# Patient Record
Sex: Female | Born: 1970
Health system: Southern US, Community
[De-identification: ages and names within clinical notes are randomized; demographics above are authoritative.]

## PROBLEM LIST (undated history)

## (undated) DIAGNOSIS — C801 Malignant (primary) neoplasm, unspecified: Secondary | ICD-10-CM

## (undated) DIAGNOSIS — R7309 Other abnormal glucose: Secondary | ICD-10-CM

## (undated) DIAGNOSIS — I10 Essential (primary) hypertension: Secondary | ICD-10-CM

## (undated) DIAGNOSIS — E119 Type 2 diabetes mellitus without complications: Secondary | ICD-10-CM

## (undated) DIAGNOSIS — E785 Hyperlipidemia, unspecified: Secondary | ICD-10-CM

## (undated) DIAGNOSIS — N882 Stricture and stenosis of cervix uteri: Secondary | ICD-10-CM

## (undated) DIAGNOSIS — D649 Anemia, unspecified: Secondary | ICD-10-CM

## (undated) HISTORY — PX: COLONOSCOPY: SHX174

## (undated) HISTORY — PX: TUBAL LIGATION: SHX77

## (undated) HISTORY — PX: WISDOM TOOTH EXTRACTION: SHX21

## (undated) HISTORY — DX: Other abnormal glucose: R73.09

## (undated) HISTORY — DX: Stricture and stenosis of cervix uteri: N88.2

## (undated) HISTORY — DX: Anemia, unspecified: D64.9

## (undated) HISTORY — DX: Type 2 diabetes mellitus without complications: E11.9

## (undated) HISTORY — DX: Hyperlipidemia, unspecified: E78.5

---
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
## 1995-09-23 HISTORY — PX: TUBAL LIGATION: SHX77

## 2014-09-11 ENCOUNTER — Emergency Department (HOSPITAL_COMMUNITY)
Admission: EM | Admit: 2014-09-11 | Discharge: 2014-09-11 | Disposition: A | Discharge status: Home / Self Care | Payer: BC Managed Care – PPO | Attending: Emergency Medicine | Admitting: Emergency Medicine

## 2014-09-11 ENCOUNTER — Encounter (HOSPITAL_COMMUNITY): Payer: Self-pay | Admitting: Family Medicine

## 2014-09-11 DIAGNOSIS — D649 Anemia, unspecified: Secondary | ICD-10-CM | POA: Diagnosis not present

## 2014-09-11 DIAGNOSIS — R102 Pelvic and perineal pain unspecified side: Secondary | ICD-10-CM

## 2014-09-11 DIAGNOSIS — Z79899 Other long term (current) drug therapy: Secondary | ICD-10-CM | POA: Insufficient documentation

## 2014-09-11 DIAGNOSIS — Z9089 Acquired absence of other organs: Secondary | ICD-10-CM | POA: Diagnosis not present

## 2014-09-11 DIAGNOSIS — Z9851 Tubal ligation status: Secondary | ICD-10-CM | POA: Insufficient documentation

## 2014-09-11 DIAGNOSIS — Z3202 Encounter for pregnancy test, result negative: Secondary | ICD-10-CM | POA: Diagnosis not present

## 2014-09-11 DIAGNOSIS — R109 Unspecified abdominal pain: Secondary | ICD-10-CM | POA: Diagnosis present

## 2014-09-11 DIAGNOSIS — I10 Essential (primary) hypertension: Secondary | ICD-10-CM | POA: Insufficient documentation

## 2014-09-11 HISTORY — DX: Essential (primary) hypertension: I10

## 2014-09-11 LAB — URINALYSIS, ROUTINE W REFLEX MICROSCOPIC
Bilirubin Urine: NEGATIVE
GLUCOSE, UA: NEGATIVE mg/dL
Ketones, ur: NEGATIVE mg/dL
LEUKOCYTES UA: NEGATIVE
Nitrite: NEGATIVE
PH: 7.5 (ref 5.0–8.0)
Protein, ur: NEGATIVE mg/dL
SPECIFIC GRAVITY, URINE: 1.017 (ref 1.005–1.030)
Urobilinogen, UA: 0.2 mg/dL (ref 0.0–1.0)

## 2014-09-11 LAB — COMPREHENSIVE METABOLIC PANEL
ALK PHOS: 68 U/L (ref 39–117)
ALT: 13 U/L (ref 0–35)
AST: 13 U/L (ref 0–37)
Albumin: 4.1 g/dL (ref 3.5–5.2)
Anion gap: 13 (ref 5–15)
BUN: 8 mg/dL (ref 6–23)
CO2: 25 mEq/L (ref 19–32)
Calcium: 8.8 mg/dL (ref 8.4–10.5)
Chloride: 103 mEq/L (ref 96–112)
Creatinine, Ser: 0.65 mg/dL (ref 0.50–1.10)
GFR calc Af Amer: >90 mL/min (ref >90)
GFR calc non Af Amer: >90 mL/min (ref >90)
Glucose, Bld: 133 mg/dL — ABNORMAL HIGH (ref 70–99)
Potassium: 3.7 mEq/L (ref 3.7–5.3)
SODIUM: 141 meq/L (ref 137–147)
TOTAL PROTEIN: 7.1 g/dL (ref 6.0–8.3)
Total Bilirubin: 0.4 mg/dL (ref 0.3–1.2)

## 2014-09-11 LAB — CBC WITH DIFFERENTIAL/PLATELET
BASOS ABS: 0 10*3/uL (ref 0.0–0.1)
Basophils Relative: 0 % (ref 0–1)
EOS ABS: 0 10*3/uL (ref 0.0–0.7)
Eosinophils Relative: 1 % (ref 0–5)
HCT: 34.8 % — ABNORMAL LOW (ref 36.0–46.0)
Hemoglobin: 11.7 g/dL — ABNORMAL LOW (ref 12.0–15.0)
Lymphocytes Relative: 15 % (ref 12–46)
Lymphs Abs: 1.3 10*3/uL (ref 0.7–4.0)
MCH: 28.8 pg (ref 26.0–34.0)
MCHC: 33.6 g/dL (ref 30.0–36.0)
MCV: 85.7 fL (ref 78.0–100.0)
Monocytes Absolute: 0.3 10*3/uL (ref 0.1–1.0)
Monocytes Relative: 4 % (ref 3–12)
Neutro Abs: 6.7 10*3/uL (ref 1.7–7.7)
Neutrophils Relative %: 80 % — ABNORMAL HIGH (ref 43–77)
PLATELETS: 225 10*3/uL (ref 150–400)
RBC: 4.06 MIL/uL (ref 3.87–5.11)
RDW: 13.4 % (ref 11.5–15.5)
WBC: 8.3 10*3/uL (ref 4.0–10.5)

## 2014-09-11 LAB — URINE MICROSCOPIC-ADD ON

## 2014-09-11 LAB — WET PREP, GENITAL
Clue Cells Wet Prep HPF POC: NONE SEEN
Trich, Wet Prep: NONE SEEN
Yeast Wet Prep HPF POC: NONE SEEN

## 2014-09-11 LAB — LIPASE, BLOOD: Lipase: 28 U/L (ref 11–59)

## 2014-09-11 LAB — POC URINE PREG, ED: PREG TEST UR: NEGATIVE

## 2014-09-11 MED ORDER — SODIUM CHLORIDE 0.9 % IV BOLUS (SEPSIS)
1000.0000 mL | Freq: Once | INTRAVENOUS | Status: AC
  Administered 2014-09-11: 1000 mL via INTRAVENOUS

## 2014-09-11 MED ORDER — PROMETHAZINE HCL 25 MG PO TABS: 25.0000 mg | ORAL_TABLET | Freq: Four times a day (QID) | ORAL | Status: DC | PRN

## 2014-09-11 NOTE — ED Provider Notes (Signed)
CSN: [id number]     Arrival date & time 09/11/14  1111 History   First MD Initiated Contact with Patient 09/11/14 1144     Chief Complaint  Patient presents with  . Abdominal Pain     (Consider location/radiation/quality/duration/timing/severity/associated sxs/prior Treatment) HPI  Suzanne Bartlett is a 43 y.o. female with PMH of PCOS, hypertension, appendectomy, tubal ligation presenting with 2-3 days of lower abdominal pain that she describes as crampy and related to her menses. At times pain radiates into top of left thigh. She has taken ibuprofen with relief of the pain. Denies any pain at this time. Patient noted passing one small clot yesterday but otherwise states her bleeding is like other menses. Patient has her menses complaint irregular. She denies taking any oral contraceptive pills or using any protection. She denies history of STDs. She denies any increase in vaginal discharge without odor. She denies any urinary symptoms. Patient denies nausea, vomiting, diarrhea. Last stool yesterday and normal nonbloody. Nursing note reported some low back pain but she denies this to me. Denies fevers or chills.   Past Medical History  Diagnosis Date  . Hypertension    Past Surgical History  Procedure Laterality Date  . Tubal ligation    . Appendectomy     History reviewed. No pertinent family history. History  Substance Use Topics  . Smoking status: Never Smoker   . Smokeless tobacco: Not on file  . Alcohol Use: No   OB History    No data available     Review of Systems  Constitutional: Negative for fever and chills.  HENT: Negative for congestion and rhinorrhea.   Eyes: Negative for visual disturbance.  Respiratory: Negative for cough and shortness of breath.   Cardiovascular: Negative for chest pain and palpitations.  Gastrointestinal: Positive for abdominal pain. Negative for nausea, vomiting and diarrhea.  Genitourinary: Negative for dysuria and hematuria.   Musculoskeletal: Negative for gait problem.  Skin: Negative for rash.  Neurological: Negative for weakness and headaches.      Allergies  Review of patient's allergies indicates no known allergies.  Home Medications   Prior to Admission medications   Medication Sig Start Date End Date Taking? Authorizing Provider  furosemide (LASIX) 20 MG tablet Take 20 mg by mouth.   Yes Historical Provider, MD  ibuprofen (ADVIL,MOTRIN) 400 MG tablet Take 800 mg by mouth every 6 (six) hours as needed for mild pain or moderate pain.   Yes Historical Provider, MD  lisinopril (PRINIVIL,ZESTRIL) 20 MG tablet Take 20 mg by mouth daily.   Yes Historical Provider, MD  promethazine (PHENERGAN) 25 MG tablet Take 1 tablet (25 mg total) by mouth every 6 (six) hours as needed for nausea or vomiting. 09/11/14   Pura Spice, PA-C   BP 112/59 mmHg  Pulse 79  Temp(Src) 97.9 F (36.6 C)  Resp 14  Ht 5\' 4"  (1.626 m)  Wt 300 lb (136.079 kg)  BMI 51.47 kg/m2  SpO2 99%  LMP 09/08/2014 Physical Exam  Constitutional: She appears well-developed and well-nourished. No distress.  HENT:  Head: Normocephalic and atraumatic.  Eyes: Conjunctivae and EOM are normal. Right eye exhibits no discharge. Left eye exhibits no discharge.  Cardiovascular: Normal rate, regular rhythm and normal heart sounds.   Pulmonary/Chest: Effort normal and breath sounds normal. No respiratory distress. She has no wheezes.  Abdominal: Soft. Bowel sounds are normal. She exhibits no distension. There is no tenderness.  Genitourinary:  Cervix pink without lesions. Os closed. No CMT or  right or left adnexal tenderness or masses noted. Minimal bloody  discharge what does not reaccumulate rapidly without foul odor. Nurse in room for exam  Neurological: She is alert. She exhibits normal muscle tone. Coordination normal.  Skin: Skin is warm and dry. She is not diaphoretic.  Nursing note and vitals reviewed.   ED Course  Procedures  (including critical care time) Labs Review Labs Reviewed  WET PREP, GENITAL - Abnormal; Notable for the following:    WBC, Wet Prep HPF POC FEW (*)    All other components within normal limits  CBC WITH DIFFERENTIAL - Abnormal; Notable for the following:    Hemoglobin 11.7 (*)    HCT 34.8 (*)    Neutrophils Relative % 80 (*)    All other components within normal limits  COMPREHENSIVE METABOLIC PANEL - Abnormal; Notable for the following:    Glucose, Bld 133 (*)    All other components within normal limits  URINALYSIS, ROUTINE W REFLEX MICROSCOPIC - Abnormal; Notable for the following:    Hgb urine dipstick MODERATE (*)    All other components within normal limits  URINE MICROSCOPIC-ADD ON - Abnormal; Notable for the following:    Squamous Epithelial / LPF FEW (*)    All other components within normal limits  GC/CHLAMYDIA PROBE AMP  LIPASE, BLOOD  RPR  HIV ANTIBODY (ROUTINE TESTING)  POC URINE PREG, ED    Imaging Review No results found.   EKG Interpretation None      MDM   Final diagnoses:  Pelvic pain in female  Mild anemia   Patient presenting with left lower sided abdominal tenderness that at times radiates into left thigh that is associated with her menses. Patient has taken ibuprofen and at presentation she is asymptomatic. She has also with the complaint of passing one small clot. Patient denies nausea, vomiting. Patient given fluids in the ED. Patient with completely benign abdominal exam. Pelvic with normal discomfort. Patient with bloody discharge that does not reaccumulate rapidly. Patient informed that STD panel is pending and she will be informed if any of the results become positive. If they are she is to inform her partners to be evaluated and/or treated. Patient nonpregnant and laboratory findings reassuring. Patient is currently not in any discomfort I doubt ovarian torsion. Wet prep with few white blood cells no clue cells. Patient without CMT or adnexal  tenderness. I doubt PID. Patient to follow-up with [facility] for further evaluation with possible outpatient pelvic ultrasound.  Discussed return precautions with patient. Discussed all results and patient verbalizes understanding and agrees with plan.  Case has been discussed with Dr. Audie Pinto who agrees with the above plan and to discharge.      Pura Spice, PA-C 09/11/14 1552  Dot Lanes, MD 09/18/14 [phone]

## 2014-09-11 NOTE — Discharge Instructions (Signed)
Return to the emergency room with worsening of symptoms, new symptoms or with symptoms that are concerning, especially severe pain, fevers, unable to tolerate fluids by mouth, losing large amounts of blood and feeling lightheaded or chest pain. Ibuprofen 400mg  (2 tablets 200mg ) every 5-6 hours for 3-5 days. Start taking 2 days before menses. Max dose in 24hours 3 grams.  Call or make an appointment with [facility] as well as discussing having/scheduling a pelvic ultrasound.   Abdominal Pain, Women Abdominal (stomach, pelvic, or belly) pain can be caused by many things. It is important to tell your doctor:  The location of the pain.  Does it come and go or is it present all the time?  Are there things that start the pain (eating certain foods, exercise)?  Are there other symptoms associated with the pain (fever, nausea, vomiting, diarrhea)? All of this is helpful to know when trying to find the cause of the pain. CAUSES   Stomach: virus or bacteria infection, or ulcer.  Intestine: appendicitis (inflamed appendix), regional ileitis (Crohn's disease), ulcerative colitis (inflamed colon), irritable bowel syndrome, diverticulitis (inflamed diverticulum of the colon), or cancer of the stomach or intestine.  Gallbladder disease or stones in the gallbladder.  Kidney disease, kidney stones, or infection.  Pancreas infection or cancer.  Fibromyalgia (pain disorder).  Diseases of the female organs:  Uterus: fibroid (non-cancerous) tumors or infection.  Fallopian tubes: infection or tubal pregnancy.  Ovary: cysts or tumors.  Pelvic adhesions (scar tissue).  Endometriosis (uterus lining tissue growing in the pelvis and on the pelvic organs).  Pelvic congestion syndrome (female organs filling up with blood just before the menstrual period).  Pain with the menstrual period.  Pain with ovulation (producing an egg).  Pain with an IUD (intrauterine device, birth control) in the  uterus.  Cancer of the female organs.  Functional pain (pain not caused by a disease, may improve without treatment).  Psychological pain.  Depression. DIAGNOSIS  Your doctor will decide the seriousness of your pain by doing an examination.  Blood tests.  X-rays.  Ultrasound.  CT scan (computed tomography, special type of X-ray).  MRI (magnetic resonance imaging).  Cultures, for infection.  Barium enema (dye inserted in the large intestine, to better view it with X-rays).  Colonoscopy (looking in intestine with a lighted tube).  Laparoscopy (minor surgery, looking in abdomen with a lighted tube).  Major abdominal exploratory surgery (looking in abdomen with a large incision). TREATMENT  The treatment will depend on the cause of the pain.   Many cases can be observed and treated at home.  Over-the-counter medicines recommended by your caregiver.  Prescription medicine.  Antibiotics, for infection.  Birth control pills, for painful periods or for ovulation pain.  Hormone treatment, for endometriosis.  Nerve blocking injections.  Physical therapy.  Antidepressants.  Counseling with a psychologist or psychiatrist.  Minor or major surgery. HOME CARE INSTRUCTIONS   Do not take laxatives, unless directed by your caregiver.  Take over-the-counter pain medicine only if ordered by your caregiver. Do not take aspirin because it can cause an upset stomach or bleeding.  Try a clear liquid diet (broth or water) as ordered by your caregiver. Slowly move to a bland diet, as tolerated, if the pain is related to the stomach or intestine.  Have a thermometer and take your temperature several times a day, and record it.  Bed rest and sleep, if it helps the pain.  Avoid sexual intercourse, if it causes pain.  Avoid  stressful situations.  Keep your follow-up appointments and tests, as your caregiver orders.  If the pain does not go away with medicine or surgery, you  may try:  Acupuncture.  Relaxation exercises (yoga, meditation).  Group therapy.  Counseling. SEEK MEDICAL CARE IF:   You notice certain foods cause stomach pain.  Your home care treatment is not helping your pain.  You need stronger pain medicine.  You want your IUD removed.  You feel faint or lightheaded.  You develop nausea and vomiting.  You develop a rash.  You are having side effects or an allergy to your medicine. SEEK IMMEDIATE MEDICAL CARE IF:   Your pain does not go away or gets worse.  You have a fever.  Your pain is felt only in portions of the abdomen. The right side could possibly be appendicitis. The left lower portion of the abdomen could be colitis or diverticulitis.  You are passing blood in your stools (bright red or black tarry stools, with or without vomiting).  You have blood in your urine.  You develop chills, with or without a fever.  You pass out. MAKE SURE YOU:   Understand these instructions.  Will watch your condition.  Will get help right away if you are not doing well or get worse. Document Released: 07/06/2007 Document Revised: 01/23/2014 Document Reviewed: 07/26/2009 [facility] Patient Information 2015 [place], [place]. This information is not intended to replace advice given to you by your health care provider. Make sure you discuss any questions you have with your health care provider.

## 2014-09-11 NOTE — ED Notes (Signed)
Pt here for left lower abdominal pain. sts pain radiates into left leg. sts ibuprofen not helping. sts urinary retention.

## 2014-09-11 NOTE — ED Notes (Signed)
Pt. Reports on her period and that she is having cramps which is abnormal for patient. States cramping to left groin/pelvic area that radiates to top of left thigh. Also reports low back pain. Took 1600mg  ibuprofen PTA and is denying pain at this time. Hx of tubal ligation.  Also reports she is on lasix, did not take yesterday and reports minimal output today after taking.

## 2014-09-12 LAB — HIV ANTIBODY (ROUTINE TESTING W REFLEX): HIV 1&2 Ab, 4th Generation: NONREACTIVE

## 2014-09-12 LAB — RPR

## 2014-09-12 LAB — GC/CHLAMYDIA PROBE AMP
CT Probe RNA: NEGATIVE
GC Probe RNA: NEGATIVE

## 2014-12-01 ENCOUNTER — Ambulatory Visit (INDEPENDENT_AMBULATORY_CARE_PROVIDER_SITE_OTHER): Payer: BLUE CROSS/BLUE SHIELD | Admitting: Obstetrics and Gynecology

## 2014-12-01 ENCOUNTER — Encounter: Payer: Self-pay | Admitting: Obstetrics and Gynecology

## 2014-12-01 VITALS — BP 142/88 | HR 86 | Resp 16 | Ht 65.0 in | Wt 298.6 lb

## 2014-12-01 DIAGNOSIS — Z1239 Encounter for other screening for malignant neoplasm of breast: Secondary | ICD-10-CM | POA: Diagnosis not present

## 2014-12-01 DIAGNOSIS — Z0000 Encounter for general adult medical examination without abnormal findings: Secondary | ICD-10-CM

## 2014-12-01 DIAGNOSIS — Z23 Encounter for immunization: Secondary | ICD-10-CM | POA: Diagnosis not present

## 2014-12-01 DIAGNOSIS — R102 Pelvic and perineal pain: Secondary | ICD-10-CM

## 2014-12-01 DIAGNOSIS — Z01419 Encounter for gynecological examination (general) (routine) without abnormal findings: Secondary | ICD-10-CM | POA: Diagnosis not present

## 2014-12-01 DIAGNOSIS — Z8742 Personal history of other diseases of the female genital tract: Secondary | ICD-10-CM

## 2014-12-01 DIAGNOSIS — N946 Dysmenorrhea, unspecified: Secondary | ICD-10-CM

## 2014-12-01 LAB — POCT URINALYSIS DIPSTICK
PH UA: 5
Urobilinogen, UA: NEGATIVE

## 2014-12-01 LAB — POCT URINE PREGNANCY: Preg Test, Ur: NEGATIVE

## 2014-12-01 NOTE — Progress Notes (Signed)
44 y.o. G2P2 MarriedCaucasianF here for annual exam.    PCP - [facility] - [place], [place]. Will continue to do primary care there. Declines doing any lab work here today.   Seen in emergency dept in December 2015 for pelvic pain related to her menses.  Pain radiating into left thigh and left lower back.  Told it was a spasm and to have ultrasound done at the time that a spasm occurs.  No imaging done at that time.   Menses were infrequent after birth of second child.  Saw her provider and was told she had PCOS and was started on OCPs to regulate them.  Eventually stopped pills.  Treated with Glucophage in past as well.  Asking what PCOS means to her.  Has hair growth on face.  Trims and plucks.  Plans to do electrolysis.   Borderline blood sugar control.   Currently having menses every 30 days, lasting for 5 days, heavy for second day.  August 2015 no cycle.  Having cramping during cycle treated with ibuprofen.  This is new for patient.  No bleeding outside of her cycle time.   Moved to [place] 4 months ago from [place].   Patient's last menstrual period was 11/06/2014.          Sexually active: Yes.    The current method of family planning is tubal ligation.    Exercising: No.  The patient does not participate in regular exercise at present. Smoker:  Former smoker, early 20's  Health Maintenance: Pap:  2012- wnl no hpv testing Histoy of abnormal Pap:  no MMG:  Never had one  Colonoscopy: never had one BMD:   Never had one  TDaP:  Is due Screening Labs: yes, Hb today: 12.3 , Urine today: Trace Leuks - asymptomatic.   reports that she has never smoked. She does not have any smokeless tobacco history on file. She reports that she drinks alcohol. She reports that she does not use illicit drugs.  Past Medical History  Diagnosis Date  . Hypertension     Past Surgical History  Procedure Laterality Date  . Tubal ligation  1197/1998  . Appendectomy   1984    Current Outpatient Prescriptions  Medication Sig Dispense Refill  . furosemide (LASIX) 20 MG tablet Take 20 mg by mouth.    Marland Kitchen lisinopril (PRINIVIL,ZESTRIL) 20 MG tablet Take 20 mg by mouth daily.    Marland Kitchen ibuprofen (ADVIL,MOTRIN) 400 MG tablet Take 800 mg by mouth every 6 (six) hours as needed for mild pain or moderate pain.     No current facility-administered medications for this visit.    Family History  Problem Relation Age of Onset  . Breast cancer Maternal Grandmother     Late 20's  . Diabetes      mother's side    ROS:  Pertinent items are noted in HPI.  Otherwise, a comprehensive ROS was negative.  Exam:   BP 142/88 mmHg  Pulse 86  Resp 16  Ht 5\' 5"  (1.651 m)  Wt 298 lb 9.6 oz (135.444 kg)  BMI 49.69 kg/m2  LMP 11/06/2014      Height: 5\' 5"  (165.1 cm)  Ht Readings from Last 3 Encounters:  12/01/14 5\' 5"  (1.651 m)  09/11/14 5\' 4"  (1.626 m)    General appearance: alert, cooperative and appears stated age Head: Normocephalic, without obvious abnormality, atraumatic Neck: no adenopathy, supple, symmetrical, trachea midline and thyroid normal to inspection and palpation Lungs: clear to auscultation  bilaterally Breasts: normal appearance, no masses or tenderness, Inspection negative, No nipple retraction or dimpling, No nipple discharge or bleeding, No axillary or supraclavicular adenopathy Heart: regular rate and rhythm Abdomen: obese, soft, non-tender; bowel sounds normal; no masses,  no organomegaly Extremities: extremities normal, atraumatic, no cyanosis or edema Skin: Skin color, texture, turgor normal. Medial thighs with brown velvety skin coloration change.  Chin with signs of hair growth.  Lymph nodes: Cervical, supraclavicular, and axillary nodes normal. No abnormal inguinal nodes palpated Neurologic: Grossly normal  Pelvic: External genitalia:  no lesions              Urethra:  normal appearing urethra with no masses, tenderness or lesions               Bartholins and Skenes: normal                 Vagina: normal appearing vagina with normal color and discharge, no lesions              Cervix: no lesions              Pap taken: Yes.   Bimanual Exam:  Uterus:  normal size, contour, position, consistency, mobility, non-tender - Bimanual exam is limited by body habitus.               Adnexa: no mass, fullness, tenderness               Rectovaginal: Confirms               Anus:  normal sphincter tone, no lesions  Chaperone was present for exam.  A:  Well Woman with normal exam. Morbid obesity.  History of PCOS. Dysmenorrhea.  Hypertension.   P:   Mammogram scheduled for patient.  This will be her first one.  pap smear and HR HPV performed.  I discussed polycystic ovarian disease and risk of diabetes and endometrial hyperplasia/cancer.   I discussed hormonal regulation of cycles and treatment of painful menses/pelvic pain with progesterone contraceptives as they do not affect blood pressure.  I discussed treatment options for facial hair growth and that estrogen containing birth control is not an option due to HTN.   Will have patient return for pelvic ultrasound.  I strongly recommended follow up of blood glucose as patient is developing stigmata of diabetes mellitus. She declines labs here today.  TDap. return annually or prn  An additional 20 minutes spent face to face time discussing dysmenorrhea/pelvic pain and PCOS - of which over 50% was spent in counseling.

## 2014-12-01 NOTE — Patient Instructions (Addendum)
EXERCISE AND DIET:  We recommended that you start or continue a regular exercise program for good health. Regular exercise means any activity that makes your heart beat faster and makes you sweat.  We recommend exercising at least 30 minutes per day at least 3 days a week, preferably 4 or 5.  We also recommend a diet low in fat and sugar.  Inactivity, poor dietary choices and obesity can cause diabetes, heart attack, stroke, and kidney damage, among others.    ALCOHOL AND SMOKING:  Women should limit their alcohol intake to no more than 7 drinks/beers/glasses of wine (combined, not each!) per week. Moderation of alcohol intake to this level decreases your risk of breast cancer and liver damage. And of course, no recreational drugs are part of a healthy lifestyle.  And absolutely no smoking or even second hand smoke. Most people know smoking can cause heart and lung diseases, but did you know it also contributes to weakening of your bones? Aging of your skin?  Yellowing of your teeth and nails?  CALCIUM AND VITAMIN D:  Adequate intake of calcium and Vitamin D are recommended.  The recommendations for exact amounts of these supplements seem to change often, but generally speaking 600 mg of calcium (either carbonate or citrate) and 800 units of Vitamin D per day seems prudent. Certain women may benefit from higher intake of Vitamin D.  If you are among these women, your doctor will have told you during your visit.    PAP SMEARS:  Pap smears, to check for cervical cancer or precancers,  have traditionally been done yearly, although recent scientific advances have shown that most women can have pap smears less often.  However, every woman still should have a physical exam from her gynecologist every year. It will include a breast check, inspection of the vulva and vagina to check for abnormal growths or skin changes, a visual exam of the cervix, and then an exam to evaluate the size and shape of the uterus and  ovaries.  And after 46 years of age, a rectal exam is indicated to check for rectal cancers. We will also provide age appropriate advice regarding health maintenance, like when you should have certain vaccines, screening for sexually transmitted diseases, bone density testing, colonoscopy, mammograms, etc.   MAMMOGRAMS:  All women over 40 years old should have a yearly mammogram. Many facilities now offer a "3D" mammogram, which may cost around $50 extra out of pocket. If possible,  we recommend you accept the option to have the 3D mammogram performed.  It both reduces the number of women who will be called back for extra views which then turn out to be normal, and it is better than the routine mammogram at detecting truly abnormal areas.    COLONOSCOPY:  Colonoscopy to screen for colon cancer is recommended for all women at age 50.  We know, you hate the idea of the prep.  We agree, BUT, having colon cancer and not knowing it is worse!!  Colon cancer so often starts as a polyp that can be seen and removed at colonscopy, which can quite literally save your life!  And if your first colonoscopy is normal and you have no family history of colon cancer, most women don't have to have it again for 10 years.  Once every ten years, you can do something that may end up saving your life, right?  We will be happy to help you get it scheduled when you are ready.    Be sure to check your insurance coverage so you understand how much it will cost.  It may be covered as a preventative service at no cost, but you should check your particular policy.     Norethindrone tablets (contraception) What is this medicine? NORETHINDRONE (nor eth IN drone) is an oral contraceptive. The product contains a female hormone known as a progestin. It is used to prevent pregnancy. This medicine may be used for other purposes; ask your health care provider or pharmacist if you have questions. COMMON BRAND NAME(S): Camila, Deblitane 28-Day,  Errin, Heather, [place], Jolivette, Donna, Nor-QD, Nora-BE, Norlyroc, Ortho Micronor, [employer] 28-Day What should I tell my health care provider before I take this medicine? They need to know if you have any of these conditions: -blood vessel disease or blood clots -breast, cervical, or vaginal cancer -diabetes -heart disease -kidney disease -liver disease -mental depression -migraine -seizures -stroke -vaginal bleeding -an unusual or allergic reaction to norethindrone, other medicines, foods, dyes, or preservatives -pregnant or trying to get pregnant -breast-feeding How should I use this medicine? Take this medicine by mouth with a glass of water. You may take it with or without food. Follow the directions on the prescription label. Take this medicine at the same time each day and in the order directed on the package. Do not take your medicine more often than directed. Contact your pediatrician regarding the use of this medicine in children. Special care may be needed. This medicine has been used in female children who have started having menstrual periods. A patient package insert for the product will be given with each prescription and refill. Read this sheet carefully each time. The sheet may change frequently. Overdosage: If you think you have taken too much of this medicine contact a poison control center or emergency room at once. NOTE: This medicine is only for you. Do not share this medicine with others. What if I miss a dose? Try not to miss a dose. Every time you miss a dose or take a dose late your chance of pregnancy increases. When 1 pill is missed (even if only 3 hours late), take the missed pill as soon as possible and continue taking a pill each day at the regular time (use a back up method of birth control for the next 48 hours). If more than 1 dose is missed, use an additional birth control method for the rest of your pill pack until menses occurs. Contact your health care  professional if more than 1 dose has been missed. What may interact with this medicine? Do not take this medicine with any of the following medications: -amprenavir or fosamprenavir -bosentan This medicine may also interact with the following medications: -antibiotics or medicines for infections, especially rifampin, rifabutin, rifapentine, and griseofulvin, and possibly penicillins or tetracyclines -aprepitant -barbiturate medicines, such as phenobarbital -carbamazepine -felbamate -modafinil -oxcarbazepine -phenytoin -ritonavir or other medicines for HIV infection or AIDS -St. John's wort -topiramate This list may not describe all possible interactions. Give your health care provider a list of all the medicines, herbs, non-prescription drugs, or dietary supplements you use. Also tell them if you smoke, drink alcohol, or use illegal drugs. Some items may interact with your medicine. What should I watch for while using this medicine? Visit your doctor or health care professional for regular checks on your progress. You will need a regular breast and pelvic exam and Pap smear while on this medicine. Use an additional method of birth control during the first cycle that  you take these tablets. If you have any reason to think you are pregnant, stop taking this medicine right away and contact your doctor or health care professional. If you are taking this medicine for hormone related problems, it may take several cycles of use to see improvement in your condition. This medicine does not protect you against HIV infection (AIDS) or any other sexually transmitted diseases. What side effects may I notice from receiving this medicine? Side effects that you should report to your doctor or health care professional as soon as possible: -breast tenderness or discharge -pain in the abdomen, chest, groin or leg -severe headache -skin rash, itching, or hives -sudden shortness of breath -unusually weak  or tired -vision or speech problems -yellowing of skin or eyes Side effects that usually do not require medical attention (report to your doctor or health care professional if they continue or are bothersome): -changes in sexual desire -change in menstrual flow -facial hair growth -fluid retention and swelling -headache -irritability -nausea -weight gain or loss This list may not describe all possible side effects. Call your doctor for medical advice about side effects. You may report side effects to FDA at 1-800-FDA-1088. Where should I keep my medicine? Keep out of the reach of children. Store at room temperature between 15 and 30 degrees C (59 and 86 degrees F). Throw away any unused medicine after the expiration date. NOTE: This sheet is a summary. It may not cover all possible information. If you have questions about this medicine, talk to your doctor, pharmacist, or health care provider.  2015, Elsevier/Gold Standard. (2012-05-28 16:41:35)  Polycystic Ovarian Syndrome Polycystic ovarian syndrome (PCOS) is a common hormonal disorder among women of reproductive age. Most women with PCOS grow many small cysts on their ovaries. PCOS can cause problems with your periods and make it difficult to get pregnant. It can also cause an increased risk of miscarriage with pregnancy. If left untreated, PCOS can lead to serious health problems, such as diabetes and heart disease. CAUSES The cause of PCOS is not fully understood, but genetics may be a factor. SIGNS AND SYMPTOMS   Infrequent or no menstrual periods.   Inability to get pregnant (infertility) because of not ovulating.   Increased growth of hair on the face, chest, stomach, back, thumbs, thighs, or toes.   Acne, oily skin, or dandruff.   Pelvic pain.   Weight gain or obesity, usually carrying extra weight around the waist.   Type 2 diabetes.   High cholesterol.   High blood pressure.   Female-pattern baldness  or thinning hair.   Patches of thickened and dark brown or black skin on the neck, arms, breasts, or thighs.   Tiny excess flaps of skin (skin tags) in the armpits or neck area.   Excessive snoring and having breathing stop at times while asleep (sleep apnea).   Deepening of the voice.   Gestational diabetes when pregnant.  DIAGNOSIS  There is no single test to diagnose PCOS.   Your health care provider will:   Take a medical history.   Perform a pelvic exam.   Have ultrasonography done.   Check your female and female hormone levels.   Measure glucose or sugar levels in the blood.   Do other blood tests.   If you are producing too many female hormones, your health care provider will make sure it is from PCOS. At the physical exam, your health care provider will want to evaluate the areas of increased hair  growth. Try to allow natural hair growth for a few days before the visit.   During a pelvic exam, the ovaries may be enlarged or swollen because of the increased number of small cysts. This can be seen more easily by using vaginal ultrasonography or screening to examine the ovaries and lining of the uterus (endometrium) for cysts. The uterine lining may become thicker if you have not been having a regular period.  TREATMENT  Because there is no cure for PCOS, it needs to be managed to prevent problems. Treatments are based on your symptoms. Treatment is also based on whether you want to have a baby or whether you need contraception.  Treatment may include:   Progesterone hormone to start a menstrual period.   Birth control pills to make you have regular menstrual periods.   Medicines to make you ovulate, if you want to get pregnant.   Medicines to control your insulin.   Medicine to control your blood pressure.   Medicine and diet to control your high cholesterol and triglycerides in your blood.  Medicine to reduce excessive hair growth.  Surgery,  making small holes in the ovary, to decrease the amount of female hormone production. This is done through a long, lighted tube (laparoscope) placed into the pelvis through a tiny incision in the lower abdomen.  HOME CARE INSTRUCTIONS  Only take over-the-counter or prescription medicine as directed by your health care provider.  Pay attention to the foods you eat and your activity levels. This can help reduce the effects of PCOS.  Keep your weight under control.  Eat foods that are low in carbohydrate and high in fiber.  Exercise regularly. SEEK MEDICAL CARE IF:  Your symptoms do not get better with medicine.  You have new symptoms. Document Released: 01/02/2005 Document Revised: 06/29/2013 Document Reviewed: 02/24/2013 [facility] Patient Information 2015 [place], [place]. This information is not intended to replace advice given to you by your health care provider. Make sure you discuss any questions you have with your health care provider.

## 2014-12-01 NOTE — Progress Notes (Signed)
Scheduled screening mammogram for [facility] at 12/13/14 at 0720. Patient is given printed appointment reminder and map to location.

## 2014-12-04 ENCOUNTER — Telehealth: Payer: Self-pay | Admitting: Obstetrics and Gynecology

## 2014-12-04 LAB — HEMOGLOBIN, FINGERSTICK: HEMOGLOBIN, FINGERSTICK: 12.3 g/dL (ref 12.0–16.0)

## 2014-12-04 NOTE — Telephone Encounter (Signed)
Left message for patient to call back. Need to go over benefits and schedule PUS °

## 2014-12-05 NOTE — Telephone Encounter (Signed)
Call to patient. Advised of benefit quote received for PUS.  Patient agreeable. She will call back to schedule. Staff message to Dr Quincy Simmonds

## 2014-12-05 NOTE — Telephone Encounter (Signed)
Returning call.

## 2014-12-06 LAB — IPS PAP TEST WITH HPV

## 2014-12-07 ENCOUNTER — Telehealth: Payer: Self-pay | Admitting: Emergency Medicine

## 2014-12-07 NOTE — Telephone Encounter (Signed)
Spoke with patient. She is scheduled for Mammogram 12/13/14. She requests a Wednesday Morning appointment before 11 am only. Scheduled first available with Dr. Quincy Simmonds within patient request parameters for 01/10/15. Patient agreeable. She will call back as needed.  Routing to provider for final review. Patient agreeable to disposition. Will close encounter

## 2014-12-07 NOTE — Telephone Encounter (Addendum)
  Per Dr. Quincy Simmonds:   Please contact patient to set up a follow up appointment for the end of March or the beginning of April.  I would like to see her after her mammogram is complete.

## 2014-12-07 NOTE — Telephone Encounter (Signed)
Message left to return call to Neve Branscomb at [phone].    

## 2014-12-13 ENCOUNTER — Ambulatory Visit
Admission: RE | Admit: 2014-12-13 | Discharge: 2014-12-13 | Disposition: A | Discharge status: Home / Self Care | Payer: BLUE CROSS/BLUE SHIELD | Source: Ambulatory Visit | Attending: Obstetrics and Gynecology | Admitting: Obstetrics and Gynecology

## 2014-12-13 DIAGNOSIS — Z1239 Encounter for other screening for malignant neoplasm of breast: Secondary | ICD-10-CM

## 2014-12-14 NOTE — Telephone Encounter (Signed)
Patient is scheduled for follow up appt. Declined PUS appt

## 2015-01-08 ENCOUNTER — Telehealth: Payer: Self-pay | Admitting: Obstetrics and Gynecology

## 2015-01-08 NOTE — Telephone Encounter (Signed)
Patient called and cancelled her appointment for "follow up." She said, "I am out of leave at work and will call back to reschedule when I am available."  Cc: Estill Bamberg

## 2015-01-08 NOTE — Telephone Encounter (Signed)
Left patient a message to call back when ready to reschedule, canceled "follow up appointment, 01/10/15"  by automated reminder call.

## 2015-01-08 NOTE — Telephone Encounter (Signed)
Thank you for the note.  I have closed the encounter.

## 2015-01-10 ENCOUNTER — Ambulatory Visit: Payer: BLUE CROSS/BLUE SHIELD | Admitting: Obstetrics and Gynecology

## 2016-02-11 ENCOUNTER — Telehealth: Payer: Self-pay | Admitting: Obstetrics and Gynecology

## 2016-02-11 ENCOUNTER — Ambulatory Visit (INDEPENDENT_AMBULATORY_CARE_PROVIDER_SITE_OTHER): Payer: BLUE CROSS/BLUE SHIELD | Admitting: Obstetrics and Gynecology

## 2016-02-11 ENCOUNTER — Encounter: Payer: Self-pay | Admitting: Obstetrics and Gynecology

## 2016-02-11 VITALS — BP 116/80 | HR 80 | Resp 16 | Ht 65.0 in | Wt 309.0 lb

## 2016-02-11 DIAGNOSIS — N921 Excessive and frequent menstruation with irregular cycle: Secondary | ICD-10-CM | POA: Diagnosis not present

## 2016-02-11 DIAGNOSIS — N926 Irregular menstruation, unspecified: Secondary | ICD-10-CM | POA: Diagnosis not present

## 2016-02-11 LAB — CBC
HCT: 36.1 % (ref 35.0–45.0)
HEMOGLOBIN: 12.2 g/dL (ref 11.7–15.5)
MCH: 28.8 pg (ref 27.0–33.0)
MCHC: 33.8 g/dL (ref 32.0–36.0)
MCV: 85.1 fL (ref 80.0–100.0)
MPV: 9.9 fL (ref 7.5–12.5)
Platelets: 276 10*3/uL (ref 140–400)
RBC: 4.24 MIL/uL (ref 3.80–5.10)
RDW: 14.9 % (ref 11.0–15.0)
WBC: 7.5 10*3/uL (ref 3.8–10.8)

## 2016-02-11 MED ORDER — MEDROXYPROGESTERONE ACETATE 10 MG PO TABS: 10.0000 mg | ORAL_TABLET | Freq: Every day | ORAL | Status: DC

## 2016-02-11 NOTE — Progress Notes (Signed)
GYNECOLOGY  VISIT   HPI: 45 y.o.   Married  Caucasian  female   G2P2 with No LMP recorded.   here for  Vaginal bleeding since 01/21/16 w/ irregular bleeding/cramping/pain in back  Was expecting menses in mid April.  Light pink bleeding started intermittently since 01/21/16.  This lasted for 4 - 5 nights. Heavy bleeding started 02/08/16 and changing a pad every 1.5 hours.  Clots. Cramping.   Not lightheaded.  Feels a little "Funny and sedated."  Prior to this was having cycles every month.  Heavy 2nd to 3rd day.  Using regular tampons.   Does have a hx of prior anemia.  Status post BTL.   Hx PCOS.   GYNECOLOGIC HISTORY: No LMP recorded. Contraception:  Tubal ligation Menopausal hormone therapy:  None Last mammogram:  12/13/14 BIRADS1 negative Last pap smear:   12/01/14 Pap and HR HPV negative        OB History    Gravida Para Term Preterm AB TAB SAB Ectopic Multiple Living   2 2        2          There are no active problems to display for this patient.   Past Medical History  Diagnosis Date  . Hypertension     Past Surgical History  Procedure Laterality Date  . Tubal ligation  1197/1998  . Appendectomy  1984    Current Outpatient Prescriptions  Medication Sig Dispense Refill  . furosemide (LASIX) 20 MG tablet Take 20 mg by mouth.    Marland Kitchen ibuprofen (ADVIL,MOTRIN) 400 MG tablet Take 800 mg by mouth every 6 (six) hours as needed for mild pain or moderate pain.    Marland Kitchen lisinopril (PRINIVIL,ZESTRIL) 20 MG tablet Take 20 mg by mouth daily.     No current facility-administered medications for this visit.     ALLERGIES: Review of patient's allergies indicates no known allergies.  Family History  Problem Relation Age of Onset  . Breast cancer Maternal Grandmother     Late 8's  . Diabetes      mother's side    Social History   Social History  . Marital Status: Married    Spouse Name: N/A  . Number of Children: N/A  . Years of Education: N/A   Occupational  History  . Not on file.   Social History Main Topics  . Smoking status: Former Research scientist (life sciences)  . Smokeless tobacco: Not on file  . Alcohol Use: 0.0 oz/week    0 Standard drinks or equivalent per week     Comment: occasionally  . Drug Use: No  . Sexual Activity: Yes    Birth Control/ Protection: Surgical     Comment: Tubal Ligation   Other Topics Concern  . Not on file   Social History Narrative    ROS:  Pertinent items are noted in HPI.  PHYSICAL EXAMINATION:    There were no vitals taken for this visit.    General appearance: alert, cooperative and appears stated age    Pelvic: External genitalia:  no lesions.  Medial thighs with brown skin color change.              Urethra:  normal appearing urethra with no masses, tenderness or lesions              Bartholins and Skenes: normal                 Vagina: normal appearing vagina with normal color and discharge, no lesions.  Red blood in the vagina.  Not rapidly accumulating.               Cervix: no lesions and anterior lip seen.              Bimanual Exam:  Uterus:  normal size, contour, position, consistency, mobility, non-tender              Adnexa: normal adnexa and no mass, fullness, tenderness              Chaperone was present for exam.  ASSESSMENT  Abnormal menses.  Menorrhagia with prolonged cycle. Probable anovulatory bleeding. Hx probable PCOS.  Status post BTL. HTN.  PLAN  Check CBC now.  Provera 10 mg po x 10 days.  Discussed side effects and mechanism of action of Provera.  Knows to expect further bleeding when stops the Provera.  Will return for an annual exam and recheck appt. In about 3 weeks. Discussed there may be need for ultrasound and further evaluation if bleeding is not controlled.  We can discuss further options for controlling menstrual cycles at her follow and annual.  Call for persistent or worsening bleeding, lightheadedness, malaise. Questions invited and answered.    An After Visit  Summary was printed and given to the patient.  __15____ minutes face to face time of which over 50% was spent in counseling.    Addendum  Patient was called at 5:00 pm and asked to do a UPT and call if it is positive.

## 2016-02-11 NOTE — Telephone Encounter (Signed)
Patient has been having cycle issues since May 1st and wants to come in to se Dr. Quincy Simmonds.

## 2016-02-11 NOTE — Telephone Encounter (Signed)
Phone call to patient in follow up to visit for today.   Patient is sexually active currently.  Last intercourse was about 2 weeks.  Status post BTL.   She did take one Provera just now.   I asked patient to go ahead and do a UPT this evening and call back if it is positive.  If negative, continue with the Provera.

## 2016-02-11 NOTE — Telephone Encounter (Signed)
Call to patient. She states she has been having some type of vaginal bleeding intermittently since 01/21/16. Starting Friday 02/08/16 patient began to have spotting and it has increased to heavy bleeding. Today she is wearing a super tampon and is soaking tampon in 1.5 hours. Felt weak yesterday, today feels "okay". Tubal ligation for contraception.  Office visit today at 36 with Dr. Quincy Simmonds. Patient advised not to drive if feels weak, dizzy or lightheaded, call 911 or seek care at nearest ER if worsening bleeding or weakness. Patient verbalizes understanding.  Routing to provider for final review. Patient agreeable to disposition. Will close encounter.

## 2016-03-05 ENCOUNTER — Encounter: Payer: Self-pay | Admitting: Obstetrics and Gynecology

## 2016-03-05 ENCOUNTER — Ambulatory Visit (INDEPENDENT_AMBULATORY_CARE_PROVIDER_SITE_OTHER): Payer: BLUE CROSS/BLUE SHIELD | Admitting: Obstetrics and Gynecology

## 2016-03-05 VITALS — BP 122/80 | HR 64 | Resp 14 | Ht 65.25 in | Wt 309.6 lb

## 2016-03-05 DIAGNOSIS — Z01419 Encounter for gynecological examination (general) (routine) without abnormal findings: Secondary | ICD-10-CM | POA: Diagnosis not present

## 2016-03-05 NOTE — Patient Instructions (Signed)
Health Maintenance, Female Adopting a healthy lifestyle and getting preventive care can go a long way to promote health and wellness. Talk with your health care provider about what schedule of regular examinations is right for you. This is a good chance for you to check in with your provider about disease prevention and staying healthy. In between checkups, there are plenty of things you can do on your own. Experts have done a lot of research about which lifestyle changes and preventive measures are most likely to keep you healthy. Ask your health care provider for more information. WEIGHT AND DIET  Eat a healthy diet  Be sure to include plenty of vegetables, fruits, low-fat dairy products, and lean protein.  Do not eat a lot of foods high in solid fats, added sugars, or salt.  Get regular exercise. This is one of the most important things you can do for your health.  Most adults should exercise for at least 150 minutes each week. The exercise should increase your heart rate and make you sweat (moderate-intensity exercise).  Most adults should also do strengthening exercises at least twice a week. This is in addition to the moderate-intensity exercise.  Maintain a healthy weight  Body mass index (BMI) is a measurement that can be used to identify possible weight problems. It estimates body fat based on height and weight. Your health care provider can help determine your BMI and help you achieve or maintain a healthy weight.  For females 20 years of age and older:   A BMI below 18.5 is considered underweight.  A BMI of 18.5 to 24.9 is normal.  A BMI of 25 to 29.9 is considered overweight.  A BMI of 30 and above is considered obese.  Watch levels of cholesterol and blood lipids  You should start having your blood tested for lipids and cholesterol at 47 years of age, then have this test every 5 years.  You may need to have your cholesterol levels checked more often if:  Your lipid  or cholesterol levels are high.  You are older than 47 years of age.  You are at high risk for heart disease.  CANCER SCREENING   Lung Cancer  Lung cancer screening is recommended for adults 55-80 years old who are at high risk for lung cancer because of a history of smoking.  A yearly low-dose CT scan of the lungs is recommended for people who:  Currently smoke.  Have quit within the past 15 years.  Have at least a 30-pack-year history of smoking. A pack year is smoking an average of one pack of cigarettes a day for 1 year.  Yearly screening should continue until it has been 15 years since you quit.  Yearly screening should stop if you develop a health problem that would prevent you from having lung cancer treatment.  Breast Cancer  Practice breast self-awareness. This means understanding how your breasts normally appear and feel.  It also means doing regular breast self-exams. Let your health care provider know about any changes, no matter how small.  If you are in your 20s or 30s, you should have a clinical breast exam (CBE) by a health care provider every 1-3 years as part of a regular health exam.  If you are 40 or older, have a CBE every year. Also consider having a breast X-ray (mammogram) every year.  If you have a family history of breast cancer, talk to your health care provider about genetic screening.  If you   are at high risk for breast cancer, talk to your health care provider about having an MRI and a mammogram every year.  Breast cancer gene (BRCA) assessment is recommended for women who have family members with BRCA-related cancers. BRCA-related cancers include:  Breast.  Ovarian.  Tubal.  Peritoneal cancers.  Results of the assessment will determine the need for genetic counseling and BRCA1 and BRCA2 testing. Cervical Cancer Your health care provider may recommend that you be screened regularly for cancer of the pelvic organs (ovaries, uterus, and  vagina). This screening involves a pelvic examination, including checking for microscopic changes to the surface of your cervix (Pap test). You may be encouraged to have this screening done every 3 years, beginning at age 21.  For women ages 30-65, health care providers may recommend pelvic exams and Pap testing every 3 years, or they may recommend the Pap and pelvic exam, combined with testing for human papilloma virus (HPV), every 5 years. Some types of HPV increase your risk of cervical cancer. Testing for HPV may also be done on women of any age with unclear Pap test results.  Other health care providers may not recommend any screening for nonpregnant women who are considered low risk for pelvic cancer and who do not have symptoms. Ask your health care provider if a screening pelvic exam is right for you.  If you have had past treatment for cervical cancer or a condition that could lead to cancer, you need Pap tests and screening for cancer for at least 20 years after your treatment. If Pap tests have been discontinued, your risk factors (such as having a new sexual partner) need to be reassessed to determine if screening should resume. Some women have medical problems that increase the chance of getting cervical cancer. In these cases, your health care provider may recommend more frequent screening and Pap tests. Colorectal Cancer  This type of cancer can be detected and often prevented.  Routine colorectal cancer screening usually begins at 47 years of age and continues through 47 years of age.  Your health care provider may recommend screening at an earlier age if you have risk factors for colon cancer.  Your health care provider may also recommend using home test kits to check for hidden blood in the stool.  A small camera at the end of a tube can be used to examine your colon directly (sigmoidoscopy or colonoscopy). This is done to check for the earliest forms of colorectal  cancer.  Routine screening usually begins at age 50.  Direct examination of the colon should be repeated every 5-10 years through 47 years of age. However, you may need to be screened more often if early forms of precancerous polyps or small growths are found. Skin Cancer  Check your skin from head to toe regularly.  Tell your health care provider about any new moles or changes in moles, especially if there is a change in a mole's shape or color.  Also tell your health care provider if you have a mole that is larger than the size of a pencil eraser.  Always use sunscreen. Apply sunscreen liberally and repeatedly throughout the day.  Protect yourself by wearing long sleeves, pants, a wide-brimmed hat, and sunglasses whenever you are outside. HEART DISEASE, DIABETES, AND HIGH BLOOD PRESSURE   High blood pressure causes heart disease and increases the risk of stroke. High blood pressure is more likely to develop in:  People who have blood pressure in the high end   of the normal range (130-139/85-89 mm Hg).  People who are overweight or obese.  People who are African American.  If you are 38-23 years of age, have your blood pressure checked every 3-5 years. If you are 61 years of age or older, have your blood pressure checked every year. You should have your blood pressure measured twice--once when you are at a hospital or clinic, and once when you are not at a hospital or clinic. Record the average of the two measurements. To check your blood pressure when you are not at a hospital or clinic, you can use:  An automated blood pressure machine at a pharmacy.  A home blood pressure monitor.  If you are between 45 years and 39 years old, ask your health care provider if you should take aspirin to prevent strokes.  Have regular diabetes screenings. This involves taking a blood sample to check your fasting blood sugar level.  If you are at a normal weight and have a low risk for diabetes,  have this test once every three years after 47 years of age.  If you are overweight and have a high risk for diabetes, consider being tested at a younger age or more often. PREVENTING INFECTION  Hepatitis B  If you have a higher risk for hepatitis B, you should be screened for this virus. You are considered at high risk for hepatitis B if:  You were born in a country where hepatitis B is common. Ask your health care provider which countries are considered high risk.  Your parents were born in a high-risk country, and you have not been immunized against hepatitis B (hepatitis B vaccine).  You have HIV or AIDS.  You use needles to inject street drugs.  You live with someone who has hepatitis B.  You have had sex with someone who has hepatitis B.  You get hemodialysis treatment.  You take certain medicines for conditions, including cancer, organ transplantation, and autoimmune conditions. Hepatitis C  Blood testing is recommended for:  Everyone born from [birth year] through 1967.  Anyone with known risk factors for hepatitis C. Sexually transmitted infections (STIs)  You should be screened for sexually transmitted infections (STIs) including gonorrhea and chlamydia if:  You are sexually active and are younger than 47 years of age.  You are older than 47 years of age and your health care provider tells you that you are at risk for this type of infection.  Your sexual activity has changed since you were last screened and you are at an increased risk for chlamydia or gonorrhea. Ask your health care provider if you are at risk.  If you do not have HIV, but are at risk, it may be recommended that you take a prescription medicine daily to prevent HIV infection. This is called pre-exposure prophylaxis (PrEP). You are considered at risk if:  You are sexually active and do not regularly use condoms or know the HIV status of your partner(s).  You take drugs by injection.  You are sexually  active with a partner who has HIV. Talk with your health care provider about whether you are at high risk of being infected with HIV. If you choose to begin PrEP, you should first be tested for HIV. You should then be tested every 3 months for as long as you are taking PrEP.  PREGNANCY   If you are premenopausal and you may become pregnant, ask your health care provider about preconception counseling.  If you may  become pregnant, take 400 to 800 micrograms (mcg) of folic acid every day.  If you want to prevent pregnancy, talk to your health care provider about birth control (contraception). OSTEOPOROSIS AND MENOPAUSE   Osteoporosis is a disease in which the bones lose minerals and strength with aging. This can result in serious bone fractures. Your risk for osteoporosis can be identified using a bone density scan.  If you are 61 years of age or older, or if you are at risk for osteoporosis and fractures, ask your health care provider if you should be screened.  Ask your health care provider whether you should take a calcium or vitamin D supplement to lower your risk for osteoporosis.  Menopause may have certain physical symptoms and risks.  Hormone replacement therapy may reduce some of these symptoms and risks. Talk to your health care provider about whether hormone replacement therapy is right for you.  HOME CARE INSTRUCTIONS   Schedule regular health, dental, and eye exams.  Stay current with your immunizations.   Do not use any tobacco products including cigarettes, chewing tobacco, or electronic cigarettes.  If you are pregnant, do not drink alcohol.  If you are breastfeeding, limit how much and how often you drink alcohol.  Limit alcohol intake to no more than 1 drink per day for nonpregnant women. One drink equals 12 ounces of beer, 5 ounces of wine, or 1 ounces of hard liquor.  Do not use street drugs.  Do not share needles.  Ask your health care provider for help if  you need support or information about quitting drugs.  Tell your health care provider if you often feel depressed.  Tell your health care provider if you have ever been abused or do not feel safe at home.   This information is not intended to replace advice given to you by your health care provider. Make sure you discuss any questions you have with your health care provider.   Document Released: 03/24/2011 Document Revised: 09/29/2014 Document Reviewed: 08/10/2013 Elsevier Interactive Patient Education [employer].

## 2016-03-05 NOTE — Progress Notes (Signed)
45 y.o. G2P2 Married Caucasian female here for annual exam.    Seen for prolonged vaginal bleeding in May.  Was tx with course of Provera.  Had minor spotting in the beginning of June.   The last time patient had prolonged bleeding was  9 - 10 years ago.   Menses have been regular occurring the 15 - 20th of every month.   Works for an [employer] consultant.  They have a wellness plan that the patient will be starting.   States she is prediabetic by her labs.  PCP:   [place] [facility].  Patient's last menstrual period was 02/23/2016.     Period Cycle (Days): 30 Period Duration (Days): 5 Days Period Pattern: Regular Menstrual Flow: Heavy Menstrual Control: Maxi pad     Sexually active: Yes.    The current method of family planning is tubal ligation.    Exercising: Yes.    Randomly.  Starting a wellness program at work.   Smoker:  no  Health Maintenance: Pap:  12/01/14-WNL neg HR HPV.  Patient will schedule at [facility].  History of abnormal Pap:  No had abnormal pap following delivery.  Follow up was normal.  MMG:  12/13/14-BIRADS1-neg Colonoscopy:  Never BMD:   Never  Result  Never TDaP:  12/01/2014 Gardasil:   no HIV: 09/11/2014-Neg   Screening Labs: PCP Hb today: Never, Urine today: No   reports that she has quit smoking. She has never used smokeless tobacco. She reports that she drinks alcohol. She reports that she does not use illicit drugs.  Past Medical History  Diagnosis Date  . Hypertension   . Elevated hemoglobin A1c     Past Surgical History  Procedure Laterality Date  . Tubal ligation  1197/1998  . Appendectomy  1984    Current Outpatient Prescriptions  Medication Sig Dispense Refill  . furosemide (LASIX) 20 MG tablet Take 20 mg by mouth.    Marland Kitchen ibuprofen (ADVIL,MOTRIN) 400 MG tablet Take 800 mg by mouth every 6 (six) hours as needed for mild pain or moderate pain.    Marland Kitchen lisinopril (PRINIVIL,ZESTRIL) 20 MG tablet Take 20 mg by mouth daily.     No current  facility-administered medications for this visit.    Family History  Problem Relation Age of Onset  . Breast cancer Maternal Grandmother     Late 53's  . Diabetes      mother's side    ROS:  Pertinent items are noted in HPI.  Otherwise, a comprehensive ROS was negative.  Exam:   BP 122/80 mmHg  Pulse 64  Resp 14  Ht 5' 5.25" (1.657 m)  Wt 309 lb 9.6 oz (140.434 kg)  BMI 51.15 kg/m2  LMP 02/23/2016    General appearance: alert, cooperative and appears stated age Head: Normocephalic, without obvious abnormality, atraumatic Neck: no adenopathy, supple, symmetrical, trachea midline and thyroid normal to inspection and palpation Lungs: clear to auscultation bilaterally Breasts: normal appearance, no masses or tenderness, Inspection negative, No nipple retraction or dimpling, No nipple discharge or bleeding, No axillary or supraclavicular adenopathy Heart: regular rate and rhythm Abdomen: obese, soft, non-tender; no masses, no organomegaly Extremities: extremities normal, atraumatic, no cyanosis or edema Skin:  Acanthosis nigricans of medial thighs.  Lymph nodes: Cervical, supraclavicular, and axillary nodes normal. No abnormal inguinal nodes palpated Neurologic: Grossly normal  Pelvic: External genitalia:  no lesions              Urethra:  normal appearing urethra with no masses, tenderness  or lesions              Bartholins and Skenes: normal                 Vagina: normal appearing vagina with normal color and discharge, no lesions              Cervix: no lesions              Pap taken: No. Bimanual Exam:  Uterus:  normal size, contour, position, consistency, mobility, non-tender.  Exam limited by [facility].               Adnexa: normal adnexa and no mass, fullness, tenderness              Rectal exam: Yes.  .  Confirms.              Anus:  normal sphincter tone, no lesions  Chaperone was present for exam.  Assessment:   Well woman visit with normal exam. Menorrhagia with  prolonged cycle. Probable anovulatory bleeding. Resolved with course of Provera. Hx probable PCOS.  Status post BTL. HTN. Prediabetic.   Plan: Yearly mammogram recommended after age 55.  Recommended self breast exam.  Pap and HR HPV as above. Discussed Calcium, Vitamin D, regular exercise program including cardiovascular and weight bearing exercise. Labs performed.  No.. Does with PCP.  Prescription medication(s) given.  No..    If skips cycles or has irregular menses, will start Micronor.  Dicussed proper diet to lower glucose.  Discussed increasing exercise.  Follow up yearly and prn.    After visit summary provided.

## 2016-05-08 ENCOUNTER — Other Ambulatory Visit: Payer: Self-pay | Admitting: Obstetrics and Gynecology

## 2016-05-08 DIAGNOSIS — Z1231 Encounter for screening mammogram for malignant neoplasm of breast: Secondary | ICD-10-CM

## 2016-05-20 ENCOUNTER — Ambulatory Visit: Payer: BLUE CROSS/BLUE SHIELD

## 2016-05-28 ENCOUNTER — Ambulatory Visit
Admission: RE | Admit: 2016-05-28 | Discharge: 2016-05-28 | Disposition: A | Discharge status: Home / Self Care | Payer: BLUE CROSS/BLUE SHIELD | Source: Ambulatory Visit | Attending: Obstetrics and Gynecology | Admitting: Obstetrics and Gynecology

## 2016-05-28 DIAGNOSIS — Z1231 Encounter for screening mammogram for malignant neoplasm of breast: Secondary | ICD-10-CM

## 2016-10-24 ENCOUNTER — Ambulatory Visit (INDEPENDENT_AMBULATORY_CARE_PROVIDER_SITE_OTHER): Payer: BLUE CROSS/BLUE SHIELD | Admitting: Podiatry

## 2016-10-24 ENCOUNTER — Ambulatory Visit (INDEPENDENT_AMBULATORY_CARE_PROVIDER_SITE_OTHER): Payer: BLUE CROSS/BLUE SHIELD

## 2016-10-24 ENCOUNTER — Encounter: Payer: Self-pay | Admitting: Podiatry

## 2016-10-24 DIAGNOSIS — M779 Enthesopathy, unspecified: Secondary | ICD-10-CM | POA: Diagnosis not present

## 2016-10-24 DIAGNOSIS — S93401A Sprain of unspecified ligament of right ankle, initial encounter: Secondary | ICD-10-CM

## 2016-10-24 MED ORDER — METHYLPREDNISOLONE 4 MG PO TBPK: ORAL_TABLET | ORAL | 0 refills | Status: DC

## 2016-10-24 NOTE — Progress Notes (Signed)
   Subjective:    Patient ID: Suzanne Bartlett, female    DOB: 1970/12/10, 46 y.o.   MRN: [id number]  HPI  46 year old female presents the also concerns her right ankle pain which has been ongoing since yesterday. She denies any recent injury or trauma. She denies any change in shoe gear or change in activity level. She has noticed some swelling and sent as better and Denies any redness or warmth. Denies any bruising. No recent treatment for this. No other complaints at this time.    Review of Systems  Musculoskeletal: Positive for gait problem.  All other systems reviewed and are negative.      Objective:   Physical Exam General: AAO x3, NAD  Dermatological: Skin is warm, dry and supple bilateral. Nails x 10 are well manicured; remaining integument appears unremarkable at this time. There are no open sores, no preulcerative lesions, no rash or signs of infection present.  Vascular: Dorsalis Pedis artery and Posterior Tibial artery pedal pulses are 2/4 bilateral with immedate capillary fill time. Pedal hair growth present.  There is no pain with calf compression, swelling, warmth, erythema.   Neruologic: Grossly intact via light touch bilateral. Vibratory intact via tuning fork bilateral. Protective threshold with Semmes Wienstein monofilament intact to all pedal sites bilateral. Negative Tinel sign.  Musculoskeletal: There is tenderness on the medial aspect of the right ankle course the flexor tendons. There is no area pinpoint tenderness or pain the vibratory sensation. There is no other areas of tenderness identified bilaterally. There is mild decrease in medial arch height. There is localized edema to the medial ankle but there is no erythema or increase in warmth. Foot and ankle range of motion bilateral. Muscular strength 5/5 in all groups tested bilateral.  Gait: Unassisted, Nonantalgic.     Assessment & Plan:   46 year old female right tendinitis  -Treatment options discussed  including all alternatives, risks, and complications -Etiology of symptoms were discussed -X-rays were obtained and reviewed with the patient. No evidence of acute fracture identified. -Prescribed medrol dose pack. Discussed side effects of the medication and directed to stop if any are to occur and call the office.  -Trilock ankle brace dispensed.  -Discussed shoegear changes -Ice -Follow-up in 2-3 weeks if symptoms continue or sooner if any problems arise. In the meantime, encouraged to call the office with any questions, concerns, change in symptoms.   Celesta Gentile, DPM

## 2016-11-06 ENCOUNTER — Encounter: Payer: Self-pay | Admitting: Podiatry

## 2016-11-06 ENCOUNTER — Ambulatory Visit (INDEPENDENT_AMBULATORY_CARE_PROVIDER_SITE_OTHER): Payer: BLUE CROSS/BLUE SHIELD | Admitting: Podiatry

## 2016-11-06 DIAGNOSIS — M779 Enthesopathy, unspecified: Secondary | ICD-10-CM

## 2016-11-06 DIAGNOSIS — M722 Plantar fascial fibromatosis: Secondary | ICD-10-CM

## 2016-11-07 NOTE — Progress Notes (Signed)
Subjective: 46 year old female presents the office today for follow-up evaluation of right ankle pain. She states that she is doing better although she still having some discomfort. She has been wearing the brace which helps and after her last when she didn't wear her cam boot that she had from previous which also helped. However she didn't go for couple days without the brace or the boot she started to get reoccurrence of pain. Although she does that the pain was improved. She does have a history of using orthotics several years ago but she has not been wearing them over the last 10-15 years. Denies any systemic complaints such as fevers, chills, nausea, vomiting. No acute changes since last appointment, and no other complaints at this time.   Objective: AAO x3, NAD DP/PT pulses palpable bilaterally, CRT less than 3 seconds At this time there is no area of tenderness to the right foot or ankle. There is no pain along the flexor tendons or the peroneal tendons. There is no pain with ankle subtalar joint range of motion. There is no pain to the foot. There is no overlying edema, erythema, increase in warmth. She states the majority tenderness is only with weightbearing. She has an increase in medial arch upon weightbearing. Subjectively there is some tenderness on medial arch of the foot at times her she's not expecting this today. No open lesions or pre-ulcerative lesions.  No pain with calf compression, swelling, warmth, erythema  Assessment: Right foot tendinitis likely result of biomechanical changes  Plan: -All treatment options discussed with the patient including all alternatives, risks, complications.  -She typically wears a slip on shoe and I discussed with her shoe changes and a more supportive tie up shoe. Also discuss orthotics and she wished to go ahead and proceed with this. She was scanned for orthotics and they were sent to [facility] labs. -Continue stretching, icing exercises as well.  Ankle brace as needed. -If symptoms continue discussed other treatment options including possible MRI, PT.  -Patient encouraged to call the office with any questions, concerns, change in symptoms.   Celesta Gentile, DPM

## 2016-11-21 ENCOUNTER — Telehealth: Payer: Self-pay | Admitting: *Deleted

## 2016-11-21 NOTE — Telephone Encounter (Signed)
Patient called and stated that she did not want the inserts.Lattie Haw

## 2016-12-03 ENCOUNTER — Ambulatory Visit: Payer: BLUE CROSS/BLUE SHIELD

## 2017-03-06 NOTE — Progress Notes (Signed)
46 y.o. G2P2 Married Caucasian female here for annual exam.    Has monthly periods.  In February 2018 had a really heavy menses.  Super tampon change almost hourly for 6 hours. No prolonged bleeding.  Almost went to the ER.  April menses was 4 days.  May and June menses were normal.  Usually changes her protection every 2 hours.  No bleeding in between cycles.  Usually has a lot of cramping.   No skipped cycles in recent past.  Hx PCOS.  In the past used birth control for cycle control.   No recent A1C or comprehensive labs with PCP.  Last sugar was 129 through her PCP office.    PCP:  Dr. Milagros Evener [facility]  Patient's last menstrual period was 02/06/2017.           Sexually active: Yes.    The current method of family planning is tubal ligation.    Exercising: No.  The patient does not participate in regular exercise at present. Smoker:  no  Health Maintenance: Pap:  12/01/14-WNL neg HR HPV History of abnormal Pap:  Yes, years ago after childbirth MMG:  05/28/16 BIRADS 1 negative/density b Colonoscopy: Never BMD:   N/a  Result  N/a TDaP:  12/01/14 Gardasil:   no HIV: 09/11/14 Negative Screening Labs:  PCP takes care of all labs   reports that she has quit smoking. She has never used smokeless tobacco. She reports that she drinks alcohol. She reports that she does not use drugs.  Past Medical History:  Diagnosis Date  . Elevated hemoglobin A1c   . Hypertension     Past Surgical History:  Procedure Laterality Date  . APPENDECTOMY  1984  . TUBAL LIGATION  1197/1998    Current Outpatient Prescriptions  Medication Sig Dispense Refill  . ibuprofen (ADVIL,MOTRIN) 400 MG tablet Take 800 mg by mouth every 6 (six) hours as needed for mild pain or moderate pain.    Marland Kitchen lisinopril-hydrochlorothiazide (PRINZIDE,ZESTORETIC) 20-12.5 MG tablet Take 2 tablets by mouth daily.     No current facility-administered medications for this visit.     Family History   Problem Relation Age of Onset  . Breast cancer Maternal Grandmother        Late 35's  . Diabetes Unknown        mother's side  . Diabetes Mother   . Glaucoma Father     ROS:  Pertinent items are noted in HPI.  Otherwise, a comprehensive ROS was negative.  Exam:   BP 112/78 (BP Location: Right Arm, Patient Position: Sitting, Cuff Size: Large)   Pulse 84   Resp 16   Ht 5' 5.75" (1.67 m)   Wt (!) 307 lb (139.3 kg)   LMP 02/06/2017   BMI 49.93 kg/m     General appearance: alert, cooperative and appears stated age Head: Normocephalic, without obvious abnormality, atraumatic Neck: no adenopathy, supple, symmetrical, trachea midline and thyroid normal to inspection and palpation Lungs: clear to auscultation bilaterally Breasts: normal appearance, no masses or tenderness, No nipple retraction or dimpling, No nipple discharge or bleeding, No axillary or supraclavicular adenopathy Heart: regular rate and rhythm Abdomen: soft, non-tender; no masses, no organomegaly Extremities: extremities normal, atraumatic, no cyanosis or edema Skin: Skin color, texture, turgor normal.  Acanthosis nigricans of medial thighs.  Lymph nodes: Cervical, supraclavicular, and axillary nodes normal. No abnormal inguinal nodes palpated Neurologic: Grossly normal  Pelvic: External genitalia:  no lesions  Urethra:  normal appearing urethra with no masses, tenderness or lesions              Bartholins and Skenes: normal                 Vagina: normal appearing vagina with normal color and discharge, no lesions              Cervix: no lesions              Pap taken: No. Bimanual Exam:  Uterus:  normal size, contour, position, consistency, mobility, non-tender              Adnexa: no mass, fullness, tenderness              Rectal exam: Yes.  .  Confirms.              Anus:  normal sphincter tone, no lesions  Chaperone was present for exam.  Assessment:   Well woman visit with normal exam. Hx  menorrhagia with regular cycles.  Dysmenorrhea. Hx probable PCOS.  Status post BTL  Prediabetes.  Acanthosis nigricans on exam. HTN.   Plan: Mammogram screening discussed. Recommended self breast awareness. Pap and HR HPV as above. Guidelines for Calcium, Vitamin D, regular exercise program including cardiovascular and weight bearing exercise. Return for pelvic ultrasound and possible EMB. We talked about progesterone treatments for her dysmenorrhea and heavy cycles.  Will check CBC and hemoglobin A1C.  Will likely need to see her PCP. Follow up annually and prn.    After visit summary provided.

## 2017-03-09 ENCOUNTER — Ambulatory Visit (INDEPENDENT_AMBULATORY_CARE_PROVIDER_SITE_OTHER): Payer: BLUE CROSS/BLUE SHIELD | Admitting: Obstetrics and Gynecology

## 2017-03-09 ENCOUNTER — Encounter: Payer: Self-pay | Admitting: Obstetrics and Gynecology

## 2017-03-09 VITALS — BP 112/78 | HR 84 | Resp 16 | Ht 65.75 in | Wt 307.0 lb

## 2017-03-09 DIAGNOSIS — N946 Dysmenorrhea, unspecified: Secondary | ICD-10-CM | POA: Diagnosis not present

## 2017-03-09 DIAGNOSIS — R7309 Other abnormal glucose: Secondary | ICD-10-CM

## 2017-03-09 DIAGNOSIS — Z01419 Encounter for gynecological examination (general) (routine) without abnormal findings: Secondary | ICD-10-CM

## 2017-03-09 DIAGNOSIS — N920 Excessive and frequent menstruation with regular cycle: Secondary | ICD-10-CM | POA: Diagnosis not present

## 2017-03-09 NOTE — Patient Instructions (Signed)

## 2017-03-10 ENCOUNTER — Encounter: Payer: Self-pay | Admitting: Obstetrics and Gynecology

## 2017-03-10 DIAGNOSIS — R7309 Other abnormal glucose: Secondary | ICD-10-CM

## 2017-03-10 HISTORY — DX: Other abnormal glucose: R73.09

## 2017-03-10 LAB — CBC
Hematocrit: 38.6 % (ref 34.0–46.6)
Hemoglobin: 13.1 g/dL (ref 11.1–15.9)
MCH: 29.2 pg (ref 26.6–33.0)
MCHC: 33.9 g/dL (ref 31.5–35.7)
MCV: 86 fL (ref 79–97)
Platelets: 291 10*3/uL (ref 150–379)
RBC: 4.49 x10E6/uL (ref 3.77–5.28)
RDW: 14.8 % (ref 12.3–15.4)
WBC: 9.3 10*3/uL (ref 3.4–10.8)

## 2017-03-10 LAB — HEMOGLOBIN A1C
ESTIMATED AVERAGE GLUCOSE: 157 mg/dL
Hgb A1c MFr Bld: 7.1 % — ABNORMAL HIGH (ref 4.8–5.6)

## 2017-03-11 ENCOUNTER — Telehealth: Payer: Self-pay | Admitting: *Deleted

## 2017-03-11 NOTE — Telephone Encounter (Signed)
Spoke with patient, advised of appointment with Dr. Zadie Rhine as seen below. Patient verbalizes understanding and is agreeable.   Labs dated 03/09/17 faxed to [phone] as requested.  Routing to provider for final review. Patient is agreeable to disposition. Will close encounter.   Cc: [name]

## 2017-03-11 NOTE — Telephone Encounter (Signed)
Spoke with patient, advised of results and recommendations as seen below per Dr.Silva. Patient states she see Dr. Zadie Rhine at [facility]. Patient states she will be out of town next week, and needs appointment after 3pm. Advised patient would call to schedule and return call with appointment details. Patient request to schedule PUS with Dr. Quincy Simmonds, scheduled for 03/26/17 at 11 am with consult to follow at 11:30am. Patient verbalizes understanding and is agreeable.   Call to [facility]/ Dr. Zadie Rhine, spoke with [place]. Patient scheduled for 03/23/17 arrive at 3:15 for 3:30pm appointment. Fax lab results to [phone].

## 2017-03-11 NOTE — Telephone Encounter (Signed)
-----   Message from Nunzio Cobbs, MD sent at 03/10/2017  4:46 PM EDT ----- Please contact patient about her blood work. Her blood counts were normal.  Her blood sugar testing indicates she has full diabetes.   Please make an appointment for her to see her PCP and forward a copy of these labs to that person. She can start following a low sugar/low carb diet and exercising regularly to bring these levels down.  Her provider will discuss management of diabetes with her.    I do want her to proceed with the ultrasound appointment so we can evaluate her menstrual bleeding and pain.

## 2017-03-19 ENCOUNTER — Telehealth: Payer: Self-pay | Admitting: Obstetrics and Gynecology

## 2017-03-19 NOTE — Telephone Encounter (Signed)
Spoke with patient on 03/17/17 to convey benefits for scheduled appointment on 03/26/17 (see account notes). Placed call to patient to follow up. Left voicemail requesting a return call.    cc: Thayer Ohm

## 2017-03-23 ENCOUNTER — Telehealth: Payer: Self-pay | Admitting: Obstetrics and Gynecology

## 2017-03-23 NOTE — Telephone Encounter (Signed)
Patient spoke with our nurse, Reesa Chew, RN, earlier today. In that conversation with the nurse, patient declined to reschedule recommended ultrasound.  I contacted patient after this conversation, to advise additional benefit options. In view of this additional information, patient is agreeable to re-scheduling the ultrasound with possible endometrial biopsy.  Patient has been re-scheduled 03/26/17 with Dr Quincy Simmonds. Patient is aware of appointment date, arrival time and cancellation policy. Patient had no further questions.  Routing to Dr Quincy Simmonds  cc: Verline Lema Sprague

## 2017-03-23 NOTE — Telephone Encounter (Signed)
Spoke with patient. Patient states that she does not wish to reschedule PUS at this time due to benefits. Will return call to schedule.  Cc: [name]  Routing to provider for final review. Patient agreeable to disposition. Will close encounter.

## 2017-03-23 NOTE — Telephone Encounter (Signed)
Patient canceled her upcoming PUS 03/26/17. Patient would like to reschedule.

## 2017-03-26 ENCOUNTER — Encounter: Payer: Self-pay | Admitting: Obstetrics and Gynecology

## 2017-03-26 ENCOUNTER — Ambulatory Visit (INDEPENDENT_AMBULATORY_CARE_PROVIDER_SITE_OTHER): Payer: BLUE CROSS/BLUE SHIELD

## 2017-03-26 ENCOUNTER — Other Ambulatory Visit: Payer: Self-pay | Admitting: Obstetrics and Gynecology

## 2017-03-26 ENCOUNTER — Ambulatory Visit (INDEPENDENT_AMBULATORY_CARE_PROVIDER_SITE_OTHER): Payer: BLUE CROSS/BLUE SHIELD | Admitting: Obstetrics and Gynecology

## 2017-03-26 ENCOUNTER — Other Ambulatory Visit: Payer: Self-pay

## 2017-03-26 VITALS — BP 122/70 | HR 76 | Resp 16 | Wt 307.0 lb

## 2017-03-26 DIAGNOSIS — N920 Excessive and frequent menstruation with regular cycle: Secondary | ICD-10-CM

## 2017-03-26 DIAGNOSIS — N882 Stricture and stenosis of cervix uteri: Secondary | ICD-10-CM | POA: Diagnosis not present

## 2017-03-26 DIAGNOSIS — N946 Dysmenorrhea, unspecified: Secondary | ICD-10-CM | POA: Diagnosis not present

## 2017-03-26 HISTORY — DX: Stricture and stenosis of cervix uteri: N88.2

## 2017-03-26 MED ORDER — NORETHINDRONE 0.35 MG PO TABS: 1.0000 | ORAL_TABLET | Freq: Every day | ORAL | 2 refills | Status: DC

## 2017-03-26 NOTE — Progress Notes (Signed)
GYNECOLOGY  VISIT   HPI: 46 y.o.   Married  Caucasian  female   G2P2 with Patient's last menstrual period was 03/11/2017.   here for   Ultrasound.  Hx menorrhagia and dysmenorrhea. Heavy cycle in Feb.  April, May and June were normal.   Pad change every 2 hours.  States she is clotting more with her menses.  The recent cycles have been more normal for patient.  No bleeding in between cycles.   Hgb 13.8 on 03/09/17.   GYNECOLOGIC HISTORY: Patient's last menstrual period was 03/11/2017. Contraception:  Tubal ligation Menopausal hormone therapy:  n/a Last mammogram:  05/28/16 BIRADS 1 negative/density b Last pap smear:    12/01/14-WNL neg HR HPV        OB History    Gravida Para Term Preterm AB Living   2 2       2    SAB TAB Ectopic Multiple Live Births                     There are no active problems to display for this patient.   Past Medical History:  Diagnosis Date  . Cervical stenosis (uterine cervix) 03/26/2017  . Elevated hemoglobin A1c 03/10/2017   level 7.1  . Hypertension     Past Surgical History:  Procedure Laterality Date  . APPENDECTOMY  1984  . TUBAL LIGATION  1197/1998    Current Outpatient Prescriptions  Medication Sig Dispense Refill  . ibuprofen (ADVIL,MOTRIN) 400 MG tablet Take 800 mg by mouth every 6 (six) hours as needed for mild pain or moderate pain.    Marland Kitchen lisinopril-hydrochlorothiazide (PRINZIDE,ZESTORETIC) 20-12.5 MG tablet Take 2 tablets by mouth daily.    . norethindrone (MICRONOR,CAMILA,ERRIN) 0.35 MG tablet Take 1 tablet (0.35 mg total) by mouth daily. 1 Package 2   No current facility-administered medications for this visit.      ALLERGIES: Patient has no known allergies.  Family History  Problem Relation Age of Onset  . Breast cancer Maternal Grandmother        Late 3's  . Diabetes Unknown        mother's side  . Diabetes Mother   . Glaucoma Father     Social History   Social History  . Marital status: Married     Spouse name: N/A  . Number of children: N/A  . Years of education: N/A   Occupational History  . Not on file.   Social History Main Topics  . Smoking status: Former Research scientist (life sciences)  . Smokeless tobacco: Never Used  . Alcohol use 0.0 oz/week     Comment: occasionally  . Drug use: No  . Sexual activity: Yes    Partners: Male    Birth control/ protection: Surgical     Comment: Tubal Ligation   Other Topics Concern  . Not on file   Social History Narrative  . No narrative on file    ROS:  Pertinent items are noted in HPI.  PHYSICAL EXAMINATION:    BP 122/70 (BP Location: Right Arm, Patient Position: Sitting, Cuff Size: Large)   Pulse 76   Resp 16   Wt (!) 307 lb (139.3 kg)   LMP 03/11/2017   BMI 49.93 kg/m     General appearance: alert, cooperative and appears stated age    Pelvic ultrasound: Uterus with no masses. EMS 6.60.   Ovaries normal.  No free fluid.   Difficult evaluation due to body habitus and poor resolution.   Attempted EMB.  Consent for procedure.  Sterile prep with Hibiclens.  Tenaculum to anterior cervical lip.  Os finder attempt to open cervical os - not successful.  Attempt to pass uterine sound - not successful.  Procedure aborted.   Chaperone was present for exam.  ASSESSMENT  Menorrhagia.  Hx PCOS.  Cervical stenosis.  Dysmenorrhea. Diabetes mellitus.  PLAN  We discussed menorrhagia and the benefits of doing EMB to rule out hyperplasia and even malignancy. Because of her cervical stenosis and the thin EMS, we will have her start Micronor for a 3 month course. She understands that this will likely help her menorrhagia and dysmenorrhea.   She was instructed in use and will start with her next menses.  Repeat attempt at EMB or hysteroscopy with dilation and curettage if menses do not improve.   She will have her TSH checked with her PCP next week.  Follow up in 3 months.    An After Visit Summary was printed and given to the  patient.  _25____ minutes face to face time of which over 50% was spent in counseling.

## 2017-03-26 NOTE — Patient Instructions (Signed)
Norethindrone tablets (contraception) What is this medicine? NORETHINDRONE (nor eth IN drone) is an oral contraceptive. The product contains a female hormone known as a progestin. It is used to prevent pregnancy. This medicine may be used for other purposes; ask your health care provider or pharmacist if you have questions. COMMON BRAND NAME(S): Camila, Deblitane 28-Day, Errin, Heather, [place], Jolivette, [place], Nor-QD, Nora-BE, Norlyroc, Ortho Micronor, [employer] 28-Day What should I tell my health care provider before I take this medicine? They need to know if you have any of these conditions: -blood vessel disease or blood clots -breast, cervical, or vaginal cancer -diabetes -heart disease -kidney disease -liver disease -mental depression -migraine -seizures -stroke -vaginal bleeding -an unusual or allergic reaction to norethindrone, other medicines, foods, dyes, or preservatives -pregnant or trying to get pregnant -breast-feeding How should I use this medicine? Take this medicine by mouth with a glass of water. You may take it with or without food. Follow the directions on the prescription label. Take this medicine at the same time each day and in the order directed on the package. Do not take your medicine more often than directed. Contact your pediatrician regarding the use of this medicine in children. Special care may be needed. This medicine has been used in female children who have started having menstrual periods. A patient package insert for the product will be given with each prescription and refill. Read this sheet carefully each time. The sheet may change frequently. Overdosage: If you think you have taken too much of this medicine contact a poison control center or emergency room at once. NOTE: This medicine is only for you. Do not share this medicine with others. What if I miss a dose? Try not to miss a dose. Every time you miss a dose or take a dose late your chance of  pregnancy increases. When 1 pill is missed (even if only 3 hours late), take the missed pill as soon as possible and continue taking a pill each day at the regular time (use a back up method of birth control for the next 48 hours). If more than 1 dose is missed, use an additional birth control method for the rest of your pill pack until menses occurs. Contact your health care professional if more than 1 dose has been missed. What may interact with this medicine? Do not take this medicine with any of the following medications: -amprenavir or fosamprenavir -bosentan This medicine may also interact with the following medications: -antibiotics or medicines for infections, especially rifampin, rifabutin, rifapentine, and griseofulvin, and possibly penicillins or tetracyclines -aprepitant -barbiturate medicines, such as phenobarbital -carbamazepine -felbamate -modafinil -oxcarbazepine -phenytoin -ritonavir or other medicines for HIV infection or AIDS -St. John's wort -topiramate This list may not describe all possible interactions. Give your health care provider a list of all the medicines, herbs, non-prescription drugs, or dietary supplements you use. Also tell them if you smoke, drink alcohol, or use illegal drugs. Some items may interact with your medicine. What should I watch for while using this medicine? Visit your doctor or health care professional for regular checks on your progress. You will need a regular breast and pelvic exam and Pap smear while on this medicine. Use an additional method of birth control during the first cycle that you take these tablets. If you have any reason to think you are pregnant, stop taking this medicine right away and contact your doctor or health care professional. If you are taking this medicine for hormone related problems, it  may take several cycles of use to see improvement in your condition. This medicine does not protect you against HIV infection (AIDS)  or any other sexually transmitted diseases. What side effects may I notice from receiving this medicine? Side effects that you should report to your doctor or health care professional as soon as possible: -breast tenderness or discharge -pain in the abdomen, chest, groin or leg -severe headache -skin rash, itching, or hives -sudden shortness of breath -unusually weak or tired -vision or speech problems -yellowing of skin or eyes Side effects that usually do not require medical attention (report to your doctor or health care professional if they continue or are bothersome): -changes in sexual desire -change in menstrual flow -facial hair growth -fluid retention and swelling -headache -irritability -nausea -weight gain or loss This list may not describe all possible side effects. Call your doctor for medical advice about side effects. You may report side effects to FDA at 1-800-FDA-1088. Where should I keep my medicine? Keep out of the reach of children. Store at room temperature between 15 and 30 degrees C (59 and 86 degrees F). Throw away any unused medicine after the expiration date. NOTE: This sheet is a summary. It may not cover all possible information. If you have questions about this medicine, talk to your doctor, pharmacist, or health care provider.  2018 Elsevier/Gold Standard (2012-05-28 16:41:35)  

## 2017-03-26 NOTE — Progress Notes (Signed)
Encounter reviewed by Dr. Melba Araki Amundson C. Silva.  

## 2017-03-27 ENCOUNTER — Encounter: Payer: Self-pay | Admitting: Obstetrics and Gynecology

## 2017-04-29 ENCOUNTER — Telehealth: Payer: Self-pay | Admitting: Obstetrics and Gynecology

## 2017-04-29 NOTE — Telephone Encounter (Signed)
Spoke with patient. Patient states that she started first pack of Micronor on 04/18/17 which was the first day of her menses. Bleeding decreased to spotting  For a few days but has now increased to light bleeding. Patient has been taking her pills at the same time daily and has not missed any pills. Patient is frustrated with ongoing bleeding and would like to know what she can do to stop menses. Patient had PUS on 03/26/17 due to menorrhagia and dysmenorrhea. EMS was 6.60. Unsuccessful EMB. Advised will review with Dr.Silva and return call with further recommendations.

## 2017-04-29 NOTE — Telephone Encounter (Signed)
Options at this point are: - continue with the Micronor to see if the bleeding profile improves, as she has only been taking it for a short time.  - attempted repeat endometrial biopsy in the office.  - proceed with outpatient dilation and curettage.  - we cannot proceed with a stronger treatment without adequate endometrial sampling through repeat biopsy or dilation and curettage.

## 2017-04-29 NOTE — Telephone Encounter (Signed)
Patient started progesterone pills when her period started on 04/18/17.  She states it is light and she is still having her period.

## 2017-04-30 NOTE — Telephone Encounter (Signed)
Spoke with patient. Advised of message as seen below from [place]. Patient states that she would like to continue taking Micronor daily and see if bleeding improves. Will contact the office if bleeding becomes heavy or extended.  Routing to provider for final review. Patient agreeable to disposition. Will close encounter.

## 2017-04-30 NOTE — Telephone Encounter (Signed)
Left message to call Nashaun Hillmer at [phone]. 

## 2017-04-30 NOTE — Telephone Encounter (Signed)
Return call to Kaitlyn. °

## 2017-06-26 ENCOUNTER — Ambulatory Visit: Payer: BLUE CROSS/BLUE SHIELD | Admitting: Obstetrics and Gynecology

## 2017-07-08 ENCOUNTER — Other Ambulatory Visit: Payer: Self-pay | Admitting: Obstetrics and Gynecology

## 2017-07-08 NOTE — Telephone Encounter (Signed)
Medication refill request: OCP Last AEX:  03/09/17 Dr. Quincy Simmonds  Next AEX: 03/22/18.  Last MMG (if hormonal medication request): 05/28/16 BIRADS1:neg  Refill authorized: 03/26/17 #1pack/2R  Has f/u appt 08/05/17. Please advise.

## 2017-07-09 ENCOUNTER — Ambulatory Visit: Payer: BLUE CROSS/BLUE SHIELD | Admitting: Obstetrics and Gynecology

## 2017-08-05 ENCOUNTER — Ambulatory Visit: Payer: BLUE CROSS/BLUE SHIELD | Admitting: Obstetrics and Gynecology

## 2017-08-05 ENCOUNTER — Encounter: Payer: Self-pay | Admitting: Obstetrics and Gynecology

## 2017-08-05 ENCOUNTER — Other Ambulatory Visit: Payer: Self-pay

## 2017-08-05 ENCOUNTER — Other Ambulatory Visit: Payer: Self-pay | Admitting: Obstetrics and Gynecology

## 2017-08-05 VITALS — BP 120/76 | HR 84 | Resp 16 | Wt 312.0 lb

## 2017-08-05 DIAGNOSIS — Z139 Encounter for screening, unspecified: Secondary | ICD-10-CM

## 2017-08-05 DIAGNOSIS — N946 Dysmenorrhea, unspecified: Secondary | ICD-10-CM

## 2017-08-05 DIAGNOSIS — N920 Excessive and frequent menstruation with regular cycle: Secondary | ICD-10-CM

## 2017-08-05 MED ORDER — NORETHINDRONE 0.35 MG PO TABS: 1.0000 | ORAL_TABLET | Freq: Every day | ORAL | 1 refills | Status: DC

## 2017-08-05 NOTE — Progress Notes (Signed)
GYNECOLOGY  VISIT   HPI: 46 y.o.   Married  Caucasian  female   G2P2 with Patient's last menstrual period was 07/22/2017.   here for 3 month follow up on Micronor.    On POPs for menorrhagia and dysmenorrhea.   [place] on 03/26/17: Uterus with no fibroids.  EMS 6.60 and normal ovaries.   Unable to do EMB on the same date due to cervical stenosis.   For the first 6 weeks, had irregular menses due to forgetting to take the pills on time.  Now sets her phone, and takes them regularly now. Menses has been lighter and less cramping.  July - spotting for 2 days, heavy 2 days, and then spotting randomly for 4 days.   August 21 - lasting 6 days and light. Sept  30 - lasting 6 days and light. October 31 -  lasting 6 days and light.   GYNECOLOGIC HISTORY: Patient's last menstrual period was 07/22/2017. Contraception:  Tubal Menopausal hormone therapy:  n/a Last mammogram: 05-28-16 Density B/Neg/BiRads1:TBC Last pap smear:  12/01/14-WNL neg HR HPV         OB History    Gravida Para Term Preterm AB Living   2 2       2    SAB TAB Ectopic Multiple Live Births                     There are no active problems to display for this patient.   Past Medical History:  Diagnosis Date  . Cervical stenosis (uterine cervix) 03/26/2017  . Elevated hemoglobin A1c 03/10/2017   level 7.1  . Hypertension     Past Surgical History:  Procedure Laterality Date  . APPENDECTOMY  1984  . TUBAL LIGATION  1197/1998    Current Outpatient Medications  Medication Sig Dispense Refill  . ibuprofen (ADVIL,MOTRIN) 400 MG tablet Take 800 mg by mouth every 6 (six) hours as needed for mild pain or moderate pain.    Marland Kitchen lisinopril-hydrochlorothiazide (PRINZIDE,ZESTORETIC) 20-12.5 MG tablet Take 2 tablets by mouth daily.    Marland Kitchen SHAROBEL 0.35 MG tablet TAKE 1 TABLET BY MOUTH ONCE DAILY 28 tablet 0   No current facility-administered medications for this visit.      ALLERGIES: Patient has no known allergies.  Family  History  Problem Relation Age of Onset  . Breast cancer Maternal Grandmother        Late 30's  . Diabetes Unknown        mother's side  . Diabetes Mother   . Glaucoma Father     Social History   Socioeconomic History  . Marital status: Married    Spouse name: Not on file  . Number of children: Not on file  . Years of education: Not on file  . Highest education level: Not on file  Social Needs  . Financial resource strain: Not on file  . Food insecurity - worry: Not on file  . Food insecurity - inability: Not on file  . Transportation needs - medical: Not on file  . Transportation needs - non-medical: Not on file  Occupational History  . Not on file  Tobacco Use  . Smoking status: Former Research scientist (life sciences)  . Smokeless tobacco: Never Used  Substance and Sexual Activity  . Alcohol use: Yes    Alcohol/week: 0.0 oz    Comment: occasionally  . Drug use: No  . Sexual activity: Yes    Partners: Male    Birth control/protection: Surgical  Comment: Tubal Ligation  Other Topics Concern  . Not on file  Social History Narrative  . Not on file    ROS:  Pertinent items are noted in HPI.  PHYSICAL EXAMINATION:    BP 120/76 (BP Location: Right Arm, Patient Position: Sitting, Cuff Size: Large)   Pulse 84   Resp 16   Wt (!) 312 lb (141.5 kg)   LMP 07/22/2017   BMI 50.74 kg/m     General appearance: alert, cooperative and appears stated age   ASSESSMENT  Menorrhagia.  Dysmenorrhea. Symptoms controlled on POPs.  PLAN  We will update her mammogram.  Scheduled 09/03/17. I am refill her POPs for 2 more months until her mammogram is back and normal.  She will call if she has irregular bleeding or return to heavy cycles.  This will prompt a hysteroscopy with dilation and curettage.    An After Visit Summary was printed and given to the patient.  _15_____ minutes face to face time of which over 50% was spent in counseling.

## 2017-09-03 ENCOUNTER — Ambulatory Visit
Admission: RE | Admit: 2017-09-03 | Discharge: 2017-09-03 | Disposition: A | Discharge status: Home / Self Care | Payer: BLUE CROSS/BLUE SHIELD | Source: Ambulatory Visit | Attending: Obstetrics and Gynecology | Admitting: Obstetrics and Gynecology

## 2017-09-03 DIAGNOSIS — Z139 Encounter for screening, unspecified: Secondary | ICD-10-CM

## 2017-09-22 DIAGNOSIS — C801 Malignant (primary) neoplasm, unspecified: Secondary | ICD-10-CM

## 2017-09-22 HISTORY — DX: Malignant (primary) neoplasm, unspecified: C80.1

## 2017-09-22 HISTORY — PX: EXTERNAL EAR SURGERY: SHX627

## 2017-09-23 ENCOUNTER — Encounter: Payer: Self-pay | Admitting: Obstetrics and Gynecology

## 2017-09-23 ENCOUNTER — Telehealth: Payer: Self-pay | Admitting: Obstetrics and Gynecology

## 2017-09-23 NOTE — Telephone Encounter (Signed)
Call returned to patient to confirm pharmacy on file. Patient states she her menses is late, LMP 08/12/17. Reports cycles have been regular.    Denies any missed or late pills, started POP 03/2017. Hx of tubal ligation. Denies any other GYN symptoms.   Patient states she was to advise of any irregular or heavy bleeding.   Patient is aware Dr. Quincy Simmonds is on LOA. Advised patient will review with Dr. Talbert Nan, covering provider and return call, patient is agreeable.   Dr. Talbert Nan  -please review and advise?

## 2017-09-23 NOTE — Telephone Encounter (Signed)
-----   Message from [place], Generic sent at 09/23/2017 1:07 PM EST -----    Dr Quincy Simmonds said to send a message after the Mammogram was completed and she will send a refill of the Norethindrone to the pharmacy to get me thru my next visit in July. Thanks! Burke Keels [phone]

## 2017-09-23 NOTE — Telephone Encounter (Signed)
Yes, please refill her medicatin

## 2017-09-23 NOTE — Telephone Encounter (Signed)
Med refill request: norethindrone 0.35mg  Last AEX: 03/09/17 -Dr. Quincy Simmonds  Next AEX: 03/22/18 -Dr. Quincy Simmonds  Last MMG (if hormonal med) 09/03/17 at [place] 1 negative; routine screening 1 year recommended  Routing to covering provider for review.   Dr. Talbert Nan  -ok to send refill of norethindrone 0.35 mg until next AEX?

## 2017-09-24 ENCOUNTER — Encounter: Payer: Self-pay | Admitting: Obstetrics and Gynecology

## 2017-09-24 MED ORDER — NORETHINDRONE 0.35 MG PO TABS: 1.0000 | ORAL_TABLET | Freq: Every day | ORAL | 6 refills | Status: DC

## 2017-09-24 NOTE — Telephone Encounter (Signed)
Spoke with patient. States she has not missed any pills, takes roughly same time everyday. Advised as seen below per Dr. Talbert Nan. Rx for norethindrone #28 tab/6RF sent to verified pharmacy on file. Patient verbalizes understanding and is agreeable.   Routing to provider for final review. Patient is agreeable to disposition. Will close encounter.

## 2017-09-24 NOTE — Telephone Encounter (Signed)
Has she missed any of her progesterone pills? Cycles can be irregular on the mini-pill. As long as she has been taking them regularly she can just continue. She has had a tubal, so no significant risk of pregnancy

## 2017-12-14 DIAGNOSIS — H6120 Impacted cerumen, unspecified ear: Secondary | ICD-10-CM | POA: Diagnosis not present

## 2017-12-14 DIAGNOSIS — E119 Type 2 diabetes mellitus without complications: Secondary | ICD-10-CM | POA: Diagnosis not present

## 2017-12-21 DIAGNOSIS — H9202 Otalgia, left ear: Secondary | ICD-10-CM | POA: Diagnosis not present

## 2017-12-21 DIAGNOSIS — H6122 Impacted cerumen, left ear: Secondary | ICD-10-CM | POA: Diagnosis not present

## 2018-03-17 DIAGNOSIS — M25561 Pain in right knee: Secondary | ICD-10-CM | POA: Diagnosis not present

## 2018-03-22 ENCOUNTER — Other Ambulatory Visit (HOSPITAL_COMMUNITY)
Admission: RE | Admit: 2018-03-22 | Discharge: 2018-03-22 | Disposition: A | Discharge status: Home / Self Care | Payer: BLUE CROSS/BLUE SHIELD | Source: Ambulatory Visit | Attending: Obstetrics and Gynecology | Admitting: Obstetrics and Gynecology

## 2018-03-22 ENCOUNTER — Ambulatory Visit: Payer: BLUE CROSS/BLUE SHIELD | Admitting: Obstetrics and Gynecology

## 2018-03-22 ENCOUNTER — Encounter: Payer: Self-pay | Admitting: Obstetrics and Gynecology

## 2018-03-22 ENCOUNTER — Other Ambulatory Visit: Payer: Self-pay

## 2018-03-22 VITALS — BP 136/72 | HR 74 | Resp 12 | Ht 65.5 in | Wt 306.5 lb

## 2018-03-22 DIAGNOSIS — Z79899 Other long term (current) drug therapy: Secondary | ICD-10-CM | POA: Diagnosis not present

## 2018-03-22 DIAGNOSIS — Z1211 Encounter for screening for malignant neoplasm of colon: Secondary | ICD-10-CM

## 2018-03-22 DIAGNOSIS — Z01419 Encounter for gynecological examination (general) (routine) without abnormal findings: Secondary | ICD-10-CM | POA: Insufficient documentation

## 2018-03-22 MED ORDER — NORETHINDRONE 0.35 MG PO TABS: 1.0000 | ORAL_TABLET | Freq: Every day | ORAL | 11 refills | Status: DC

## 2018-03-22 NOTE — Patient Instructions (Signed)

## 2018-03-22 NOTE — Progress Notes (Signed)
47 y.o. G2P2 Married Caucasian female here for annual exam.    On Micronor.  Cramps eliminated.  Menses are a lot lighter.  Last week menses was only panty liner worth.  Hot flashes at night every now and then.   Blood glucose 130s - 150s.  Hgb A1C in low 7 range.   Had sciatica and then a recent fall.  Her walking is limited.   PCP:   Milagros Evener, MD  Patient's last menstrual period was 03/12/2018.     Period Cycle (Days): 35 Period Duration (Days): 5 Period Pattern: Regular Menstrual Flow: Light Menstrual Control: Tampon, Panty liner Dysmenorrhea: None     Sexually active: Yes.    The current method of family planning is tubal ligation.    Exercising: No.  The patient does not participate in regular exercise at present. Smoker:  Former smoker   Health Maintenance: Pap:  12-01-14 negative, HR HPV negative  History of abnormal Pap:  yes MMG:  09-03-17 density b/BIRADS 1 negative  Colonoscopy:  never BMD:   n/a  Result  n/a TDaP:  12-01-14  Gardasil:   no HIV: 09-11-14 negative  Hep C: no Screening Labs:  Hb today: PCP, Urine today: not colleced   reports that she has quit smoking. She has never used smokeless tobacco. She reports that she drinks alcohol. She reports that she does not use drugs.  Past Medical History:  Diagnosis Date  . Cervical stenosis (uterine cervix) 03/26/2017  . Elevated hemoglobin A1c 03/10/2017   level 7.1  . Hypertension     Past Surgical History:  Procedure Laterality Date  . APPENDECTOMY  1984  . TUBAL LIGATION  1197/1998    Current Outpatient Medications  Medication Sig Dispense Refill  . ibuprofen (ADVIL,MOTRIN) 400 MG tablet Take 800 mg by mouth every 6 (six) hours as needed for mild pain or moderate pain.    Marland Kitchen lisinopril-hydrochlorothiazide (PRINZIDE,ZESTORETIC) 20-12.5 MG tablet Take 2 tablets by mouth daily.    Marland Kitchen METFORMIN HCL PO Take 500 mg by mouth 2 (two) times daily.    . norethindrone (SHAROBEL) 0.35 MG tablet Take  1 tablet (0.35 mg total) by mouth daily. 28 tablet 6  . ONETOUCH VERIO test strip USE 1 STRIP TO CHECK GLUCOSE ONCE DAILY  11   No current facility-administered medications for this visit.     Family History  Problem Relation Age of Onset  . Breast cancer Maternal Grandmother        Late 17's  . Diabetes Unknown        mother's side  . Diabetes Mother   . Glaucoma Father     Review of Systems  Constitutional: Negative.   HENT: Negative.   Eyes: Negative.   Respiratory: Negative.   Cardiovascular: Negative.   Gastrointestinal: Negative.   Endocrine: Negative.   Genitourinary: Negative.   Musculoskeletal: Negative.   Skin: Negative.   Allergic/Immunologic: Negative.   Neurological: Negative.   Hematological: Negative.   Psychiatric/Behavioral: Negative.     Exam:   BP 136/72 (BP Location: Right Arm, Patient Position: Sitting, Cuff Size: Large)   Pulse 74   Resp 12   Ht 5' 5.5" (1.664 m)   Wt (!) 306 lb 8 oz (139 kg)   LMP 03/12/2018   BMI 50.23 kg/m     General appearance: alert, cooperative and appears stated age Head: Normocephalic, without obvious abnormality, atraumatic Neck: no adenopathy, supple, symmetrical, trachea midline and thyroid normal to inspection and palpation Lungs: clear to  auscultation bilaterally Breasts: normal appearance, no masses or tenderness, No nipple retraction or dimpling, No nipple discharge or bleeding, No axillary or supraclavicular adenopathy Heart: regular rate and rhythm Abdomen: soft, non-tender; no masses, no organomegaly Extremities: extremities normal, atraumatic, no cyanosis or edema Skin: Skin color, texture, turgor normal. No rashes or lesions Lymph nodes: Cervical, supraclavicular, and axillary nodes normal. No abnormal inguinal nodes palpated Neurologic: Grossly normal  Pelvic: External genitalia:  no lesions              Urethra:  normal appearing urethra with no masses, tenderness or lesions              Bartholins  and Skenes: normal                 Vagina: normal appearing vagina with normal color and discharge, no lesions              Cervix: no lesions              Pap taken: Yes.   Bimanual Exam:  Uterus:  normal size, contour, position, consistency, mobility, non-tender.  BM exam limited by [facility].              Adnexa: no mass, fullness, tenderness              Rectal exam: Yes.  .  Confirms.              Anus:  normal sphincter tone, no lesions  Chaperone was present for exam.  Assessment:   Well woman visit with normal exam. Status post BTL.  Menorrhagia and dysmenorrhea. Controlled on POP.  FH breast cancer.  DM.   Plan: Mammogram screening. Recommended self breast awareness. Pap and HR HPV as above. Guidelines for Calcium, Vitamin D, regular exercise program including cardiovascular and weight bearing exercise. Refill of Micronor for 1 year.  IFOB.  Labs with PCP.  I educated patient about endocrinology specialists.  Follow up annually and prn.   After visit summary provided.

## 2018-03-24 LAB — CYTOLOGY - PAP
DIAGNOSIS: NEGATIVE
HPV: NOT DETECTED

## 2018-04-04 DIAGNOSIS — Z1211 Encounter for screening for malignant neoplasm of colon: Secondary | ICD-10-CM | POA: Diagnosis not present

## 2018-04-08 LAB — FECAL OCCULT BLOOD, IMMUNOCHEMICAL: Fecal Occult Bld: NEGATIVE

## 2018-07-19 DIAGNOSIS — I10 Essential (primary) hypertension: Secondary | ICD-10-CM | POA: Diagnosis not present

## 2018-07-19 DIAGNOSIS — Z6841 Body Mass Index (BMI) 40.0 and over, adult: Secondary | ICD-10-CM | POA: Diagnosis not present

## 2018-07-19 DIAGNOSIS — E119 Type 2 diabetes mellitus without complications: Secondary | ICD-10-CM | POA: Diagnosis not present

## 2018-07-21 ENCOUNTER — Other Ambulatory Visit: Payer: Self-pay | Admitting: Obstetrics and Gynecology

## 2018-07-21 DIAGNOSIS — Z1231 Encounter for screening mammogram for malignant neoplasm of breast: Secondary | ICD-10-CM

## 2018-08-31 DIAGNOSIS — L814 Other melanin hyperpigmentation: Secondary | ICD-10-CM | POA: Diagnosis not present

## 2018-08-31 DIAGNOSIS — C44219 Basal cell carcinoma of skin of left ear and external auricular canal: Secondary | ICD-10-CM | POA: Diagnosis not present

## 2018-08-31 DIAGNOSIS — D485 Neoplasm of uncertain behavior of skin: Secondary | ICD-10-CM | POA: Diagnosis not present

## 2018-08-31 DIAGNOSIS — L579 Skin changes due to chronic exposure to nonionizing radiation, unspecified: Secondary | ICD-10-CM | POA: Diagnosis not present

## 2018-08-31 DIAGNOSIS — L821 Other seborrheic keratosis: Secondary | ICD-10-CM | POA: Diagnosis not present

## 2018-09-06 ENCOUNTER — Ambulatory Visit
Admission: RE | Admit: 2018-09-06 | Discharge: 2018-09-06 | Disposition: A | Discharge status: Home / Self Care | Payer: BLUE CROSS/BLUE SHIELD | Source: Ambulatory Visit | Attending: Obstetrics and Gynecology | Admitting: Obstetrics and Gynecology

## 2018-09-06 DIAGNOSIS — Z1231 Encounter for screening mammogram for malignant neoplasm of breast: Secondary | ICD-10-CM

## 2018-10-12 DIAGNOSIS — C44219 Basal cell carcinoma of skin of left ear and external auricular canal: Secondary | ICD-10-CM | POA: Diagnosis not present

## 2019-01-20 DIAGNOSIS — E119 Type 2 diabetes mellitus without complications: Secondary | ICD-10-CM | POA: Diagnosis not present

## 2019-01-20 DIAGNOSIS — I10 Essential (primary) hypertension: Secondary | ICD-10-CM | POA: Diagnosis not present

## 2019-02-01 DIAGNOSIS — C44219 Basal cell carcinoma of skin of left ear and external auricular canal: Secondary | ICD-10-CM | POA: Diagnosis not present

## 2019-02-02 DIAGNOSIS — C44219 Basal cell carcinoma of skin of left ear and external auricular canal: Secondary | ICD-10-CM | POA: Diagnosis not present

## 2019-02-16 DIAGNOSIS — A499 Bacterial infection, unspecified: Secondary | ICD-10-CM | POA: Diagnosis not present

## 2019-03-10 ENCOUNTER — Telehealth: Payer: Self-pay | Admitting: Obstetrics and Gynecology

## 2019-03-10 NOTE — Telephone Encounter (Signed)
Spoke with patient. Patient reports swelling in right axilla that she noticed on 03/05/19. Denies pain, lumps, redness, fever/chills, fatigue, nipple d/c or breast changes. 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 Reports having "cancer removed from ear in 01/2019". Initially thought this was related to her bra, not resolved with changing bra. Advised OV needed for further evaluation. Patient declined OV with covering provider on 6/19, patient request OV next wk with Dr. Quincy Simmonds. OV scheduled for 6/23 at 4:30pm with Dr. Quincy Simmonds. [id number] precautions reviewed. Return call to office if desires earlier OV. ER/urgent care precautions reviewed for new or worsening symptoms after hours or weekend.   Last screening MMG 09/06/18, negative.   Routing to provider for final review. Patient is agreeable to disposition. Will close encounter.

## 2019-03-10 NOTE — Telephone Encounter (Signed)
Patient is swollen under her right arm.

## 2019-03-11 ENCOUNTER — Other Ambulatory Visit: Payer: Self-pay

## 2019-03-15 ENCOUNTER — Encounter: Payer: Self-pay | Admitting: Obstetrics and Gynecology

## 2019-03-15 ENCOUNTER — Telehealth: Payer: Self-pay | Admitting: Obstetrics and Gynecology

## 2019-03-15 ENCOUNTER — Ambulatory Visit: Payer: BC Managed Care – PPO | Admitting: Obstetrics and Gynecology

## 2019-03-15 ENCOUNTER — Other Ambulatory Visit: Payer: Self-pay

## 2019-03-15 VITALS — BP 134/88 | HR 84 | Temp 98.2°F | Ht 64.0 in | Wt 303.0 lb

## 2019-03-15 DIAGNOSIS — N63 Unspecified lump in unspecified breast: Secondary | ICD-10-CM

## 2019-03-15 NOTE — Progress Notes (Signed)
GYNECOLOGY  VISIT   HPI: 48 y.o.   Married  Caucasian  female   G2P2 with Patient's last menstrual period was 01/17/2019.   here for swelling in right axilla     Wears an underwire bra.  She felt some discomfort under her right axillar 2 weeks ago.  She has noticed the her whole area is swollen under her armpit.  Less swollen and tender, but still is not normal per patient.   Refurbishing a boat.  Doing orbital sanding.   No trauma or insect bites.   Left ear surgery for basal cell CA May19, 2020. She had a staph infection following surgery.   GYNECOLOGIC HISTORY: Patient's last menstrual period was 01/17/2019. Contraception:  Tubal ligation Menopausal hormone therapy:  none Last mammogram:  09/06/18 BIRADS 1 negative/density b Last pap smear:   03/22/18 Neg:Neg HR HPV        OB History    Gravida  2   Para  2   Term      Preterm      AB      Living  2     SAB      TAB      Ectopic      Multiple      Live Births                 There are no active problems to display for this patient.   Past Medical History:  Diagnosis Date  . Cervical stenosis (uterine cervix) 03/26/2017  . Elevated hemoglobin A1c 03/10/2017   level 7.1  . Hypertension     Past Surgical History:  Procedure Laterality Date  . APPENDECTOMY  1984  . EXTERNAL EAR SURGERY    . TUBAL LIGATION  1197/1998    Current Outpatient Medications  Medication Sig Dispense Refill  . ibuprofen (ADVIL,MOTRIN) 400 MG tablet Take 800 mg by mouth every 6 (six) hours as needed for mild pain or moderate pain.    Marland Kitchen lisinopril-hydrochlorothiazide (PRINZIDE,ZESTORETIC) 20-12.5 MG tablet Take 2 tablets by mouth daily.    Marland Kitchen METFORMIN HCL PO Take 500 mg by mouth 2 (two) times daily.    . norethindrone (SHAROBEL) 0.35 MG tablet Take 1 tablet (0.35 mg total) by mouth daily. 28 tablet 11  . ONETOUCH VERIO test strip USE 1 STRIP TO CHECK GLUCOSE ONCE DAILY  11   No current facility-administered  medications for this visit.      ALLERGIES: Patient has no known allergies.  Family History  Problem Relation Age of Onset  . Breast cancer Maternal Grandmother        Late 64's  . Diabetes Other        mother's side  . Diabetes Mother   . Glaucoma Father     Social History   Socioeconomic History  . Marital status: Married    Spouse name: Not on file  . Number of children: Not on file  . Years of education: Not on file  . Highest education level: Not on file  Occupational History  . Not on file  Social Needs  . Financial resource strain: Not on file  . Food insecurity    Worry: Not on file    Inability: Not on file  . Transportation needs    Medical: Not on file    Non-medical: Not on file  Tobacco Use  . Smoking status: Former Research scientist (life sciences)  . Smokeless tobacco: Never Used  Substance and Sexual Activity  . Alcohol use:  Yes    Alcohol/week: 0.0 standard drinks    Comment: occasionally  . Drug use: No  . Sexual activity: Yes    Partners: Male    Birth control/protection: Surgical    Comment: Tubal Ligation  Lifestyle  . Physical activity    Days per week: Not on file    Minutes per session: Not on file  . Stress: Not on file  Relationships  . Social Herbalist on phone: Not on file    Gets together: Not on file    Attends religious service: Not on file    Active member of club or organization: Not on file    Attends meetings of clubs or organizations: Not on file    Relationship status: Not on file  . Intimate partner violence    Fear of current or ex partner: Not on file    Emotionally abused: Not on file    Physically abused: Not on file    Forced sexual activity: Not on file  Other Topics Concern  . Not on file  Social History Narrative  . Not on file    Review of Systems  Constitutional:       Swelling in right axilla   HENT: Negative.   Eyes: Negative.   Respiratory: Negative.   Cardiovascular: Negative.   Gastrointestinal:  Negative.   Endocrine: Negative.   Genitourinary: Negative.   Musculoskeletal: Negative.   Skin: Negative.   Allergic/Immunologic: Negative.   Neurological: Negative.   Hematological: Negative.   Psychiatric/Behavioral: Negative.     PHYSICAL EXAMINATION:    BP 134/88 (BP Location: Left Arm, Patient Position: Sitting, Cuff Size: Large)   Pulse 84   Temp 98.2 F (36.8 C) (Temporal)   Ht 5\' 4"  (1.626 m)   Wt (!) 303 lb (137.4 kg)   LMP 01/17/2019   BMI 52.01 kg/m     General appearance: alert, cooperative and appears stated age  Breasts: normal appearance, no masses or tenderness, No nipple retraction or dimpling, No nipple discharge or bleeding, No axillary or supraclavicular adenopathy  Chaperone was present for exam.  ASSESSMENT  Right breast swelling.  Possible musculoskeletal in origin.  FH of maternal GM with breast cancer.   PLAN  We discussed potential causes of the swelling, which may be due to over use of her arm and chest muscles. Dx mammogram and right breast and axillary [place].    An After Visit Summary was printed and given to the patient.  _15_____ minutes face to face time of which over 50% was spent in counseling.

## 2019-03-15 NOTE — Telephone Encounter (Signed)
Please contact patient to schedule dx right mammogram and right axillary and breast US for right breast swelling at 8:00 at perimeter of breast.   [facility]

## 2019-03-16 NOTE — Telephone Encounter (Signed)
Spoke with Joaquim Lai at [facility]. Patient is scheduled for right breast Dx MMG and [place], if needed, on 03/18/19 at 12:50pm, arriving at 12:30pm.

## 2019-03-16 NOTE — Telephone Encounter (Signed)
Spoke with patient, advised of appt as seen below. Patient verbalizes understanding and is agreeable to date and time.   Routing to provider for final review. Patient is agreeable to disposition. Will close encounter.

## 2019-03-17 ENCOUNTER — Other Ambulatory Visit: Payer: Self-pay | Admitting: Obstetrics and Gynecology

## 2019-03-17 NOTE — Telephone Encounter (Signed)
Medication refill request: Sharobel  Last AEX:  03-22-18 BS Next AEX: 04-29-2019 Last MMG (if hormonal medication request): 09-06-18 density B/BIRADS 1 negative, MMG scheduled for tomorrow for breast swelling  Refill authorized: 03-22-18 #28, 11RF. Today, please advise.   Medication pended for #90, 0RF. Please refill if appropriate.

## 2019-03-18 ENCOUNTER — Other Ambulatory Visit: Payer: Self-pay

## 2019-03-18 ENCOUNTER — Ambulatory Visit
Admission: RE | Admit: 2019-03-18 | Discharge: 2019-03-18 | Disposition: A | Discharge status: Home / Self Care | Payer: BC Managed Care – PPO | Source: Ambulatory Visit | Attending: Obstetrics and Gynecology | Admitting: Obstetrics and Gynecology

## 2019-03-18 ENCOUNTER — Ambulatory Visit
Admission: RE | Admit: 2019-03-18 | Discharge: 2019-03-18 | Disposition: A | Discharge status: Home / Self Care | Payer: BLUE CROSS/BLUE SHIELD | Source: Ambulatory Visit | Attending: Obstetrics and Gynecology | Admitting: Obstetrics and Gynecology

## 2019-03-18 DIAGNOSIS — N63 Unspecified lump in unspecified breast: Secondary | ICD-10-CM

## 2019-03-18 DIAGNOSIS — R928 Other abnormal and inconclusive findings on diagnostic imaging of breast: Secondary | ICD-10-CM | POA: Diagnosis not present

## 2019-03-18 DIAGNOSIS — N6489 Other specified disorders of breast: Secondary | ICD-10-CM | POA: Diagnosis not present

## 2019-04-11 ENCOUNTER — Telehealth: Payer: Self-pay | Admitting: Obstetrics and Gynecology

## 2019-04-11 MED ORDER — NORETHINDRONE 0.35 MG PO TABS: 1.0000 | ORAL_TABLET | Freq: Every day | ORAL | 0 refills | Status: DC

## 2019-04-11 NOTE — Telephone Encounter (Signed)
Last AEX  03/22/18 Next AEX 04/29/19 Last MMG 03/18/19: asymmetry of right breast, DX [place] completed, screening MMG recommended in 08/2019   Dr. Quincy Simmonds -ok to send 1 refill of Sharobel 0.35 mg tab #1pkg to patients  pharmacy of choice?

## 2019-04-11 NOTE — Telephone Encounter (Signed)
[facility] for one month refill of her progesterone only OCPs.

## 2019-04-11 NOTE — Telephone Encounter (Signed)
Spoke with patient, advised per Dr. Quincy Simmonds. Rx for [employer] 0.35mg  tab #30/0RF to [facility], [place]. Patient verbalizes understanding and is agreeable.   Encounter closed.

## 2019-04-11 NOTE — Telephone Encounter (Signed)
Patient is calling regarding refill request for Sharobel 0.35MG  tablet. Patient stated that she refilled medication, but left to go out of town and left medication at home. Patient is requesting that one pack be called in while she is out town.

## 2019-04-21 ENCOUNTER — Ambulatory Visit: Payer: BLUE CROSS/BLUE SHIELD | Admitting: Obstetrics and Gynecology

## 2019-04-29 ENCOUNTER — Ambulatory Visit: Payer: BLUE CROSS/BLUE SHIELD | Admitting: Obstetrics and Gynecology

## 2019-05-05 DIAGNOSIS — E119 Type 2 diabetes mellitus without complications: Secondary | ICD-10-CM | POA: Diagnosis not present

## 2019-05-05 DIAGNOSIS — I10 Essential (primary) hypertension: Secondary | ICD-10-CM | POA: Diagnosis not present

## 2019-05-09 DIAGNOSIS — I10 Essential (primary) hypertension: Secondary | ICD-10-CM | POA: Diagnosis not present

## 2019-05-09 DIAGNOSIS — Z6841 Body Mass Index (BMI) 40.0 and over, adult: Secondary | ICD-10-CM | POA: Diagnosis not present

## 2019-05-09 DIAGNOSIS — E119 Type 2 diabetes mellitus without complications: Secondary | ICD-10-CM | POA: Diagnosis not present

## 2019-07-04 ENCOUNTER — Other Ambulatory Visit: Payer: Self-pay

## 2019-07-05 NOTE — Progress Notes (Signed)
48 y.o. G2P2 Married Caucasian female here for annual exam.   Patient still with swelling in right axillary region. She reports she had some discomfort with a particular bra.  Not bothersome now.  Seen in office for this in June 2020.  She had dx imaging done and there was not evidence of malignancy.  She is due for screening in December 2020.  Patient states cycles getting further apart and lighter. LMP 06-23-19, PMP 05-27-19, PMP 03-22-19. She takes Micronor for dysmenorrhea and heavy cycles.  Menses now last 3 - 7 days.  Cramping is controlled. Some hot flashes in the evening, which are controlled.  A1C is 8.   Working from home during pandemic.  She and her husband are now building a home.   PCP: Milagros Evener, MD    Patient's last menstrual period was 06/23/2019 (exact date).           Sexually active: Yes.    The current method of family planning is tubal ligation.   Micronor.  Exercising: No.  The patient does not participate in regular exercise at present. Smoker:  former  Health Maintenance: Pap:03-22-18 Neg:Neg HR HPV, 12-01-14 Neg:Neg HR HPV History of abnormal Pap: no MMG: 03-18-19 Rt.Diag.w/Rt.US/Neg/density B/BiRads1.09-06-18 Neg/density B/BiRads1 Colonoscopy:  NEVER BMD:   n/a  Result  n/a TDaP:  12-01-14 Gardasil:   n/a HIV:09-11-14 NR Hep C:no Screening Labs:  PCP.    reports that she has quit smoking. She has never used smokeless tobacco. She reports previous alcohol use. She reports that she does not use drugs.  Past Medical History:  Diagnosis Date  . Cancer ([place]) 2019   Basal cell left ear  . Cervical stenosis (uterine cervix) 03/26/2017  . Elevated hemoglobin A1c 03/10/2017   level 7.1  . Hypertension     Past Surgical History:  Procedure Laterality Date  . APPENDECTOMY  1984  . EXTERNAL EAR SURGERY Left 2019   basal cell removal  . TUBAL LIGATION  1197/1998    Current Outpatient Medications  Medication Sig Dispense Refill  . ibuprofen  (ADVIL,MOTRIN) 400 MG tablet Take 800 mg by mouth every 6 (six) hours as needed for mild pain or moderate pain.    Marland Kitchen lisinopril-hydrochlorothiazide (PRINZIDE,ZESTORETIC) 20-12.5 MG tablet Take 2 tablets by mouth daily.    Marland Kitchen METFORMIN HCL PO Take 500 mg by mouth 2 (two) times daily.    . norethindrone (SHAROBEL) 0.35 MG tablet Take 1 tablet (0.35 mg total) by mouth daily. 30 tablet 0  . ONETOUCH VERIO test strip USE 1 STRIP TO CHECK GLUCOSE ONCE DAILY  11   No current facility-administered medications for this visit.     Family History  Problem Relation Age of Onset  . Breast cancer Maternal Grandmother        Late 10's  . Diabetes Other        mother's side  . Diabetes Mother   . Glaucoma Father     Review of Systems  All other systems reviewed and are negative.   Exam:   BP 126/80 (Cuff Size: Large)   Pulse 90   Temp 98.2 F (36.8 C) (Temporal)   Resp 16   Ht 5\' 5"  (1.651 m)   Wt 300 lb (136.1 kg)   LMP 06/23/2019 (Exact Date)   BMI 49.92 kg/m     General appearance: alert, cooperative and appears stated age Head: normocephalic, without obvious abnormality, atraumatic Neck: no adenopathy, supple, symmetrical, trachea midline and thyroid normal to inspection and palpation  Lungs: clear to auscultation bilaterally Breasts: normal appearance, no masses or tenderness, No nipple retraction or dimpling, No nipple discharge or bleeding, No axillary adenopathy Heart: regular rate and rhythm Abdomen: soft, non-tender; no masses, no organomegaly Extremities: extremities normal, atraumatic, no cyanosis or edema Skin: skin color, texture, turgor normal. No rashes or lesions Lymph nodes: cervical, supraclavicular, and axillary nodes normal. Neurologic: grossly normal  Pelvic: External genitalia:  no lesions              No abnormal inguinal nodes palpated.              Urethra:  normal appearing urethra with no masses, tenderness or lesions              Bartholins and Skenes:  normal                 Vagina: normal appearing vagina with normal color and discharge, no lesions              Cervix: no lesions              Pap taken: No. Bimanual Exam:  Uterus:  normal size, contour, position, consistency, mobility, non-tender              Adnexa: no mass, fullness, tenderness              BM exam limited by [facility].              Rectal exam: Yes.  .  Confirms.              Anus:  normal sphincter tone, no lesions  Chaperone was present for exam.  Assessment:   Well woman visit with normal exam. Status post BTL. Right axillary swelling.  Negative imaging.  FH breast cancer.  MGM.  No other family members.  DM.  On Metformin.   Plan: Mammogram screening discussed. Self breast awareness reviewed. Pap and HR HPV as above. Guidelines for Calcium, Vitamin D, regular exercise program including cardiovascular and weight bearing exercise. Refill of Micronor for one year. IFOB.  Declines flu vaccine.  Follow up annually and prn.   After visit summary provided.

## 2019-07-06 ENCOUNTER — Ambulatory Visit (INDEPENDENT_AMBULATORY_CARE_PROVIDER_SITE_OTHER): Payer: BC Managed Care – PPO | Admitting: Obstetrics and Gynecology

## 2019-07-06 ENCOUNTER — Other Ambulatory Visit: Payer: Self-pay

## 2019-07-06 ENCOUNTER — Encounter: Payer: Self-pay | Admitting: Obstetrics and Gynecology

## 2019-07-06 VITALS — BP 126/80 | HR 90 | Temp 98.2°F | Resp 16 | Ht 65.0 in | Wt 300.0 lb

## 2019-07-06 DIAGNOSIS — Z1211 Encounter for screening for malignant neoplasm of colon: Secondary | ICD-10-CM

## 2019-07-06 DIAGNOSIS — Z01419 Encounter for gynecological examination (general) (routine) without abnormal findings: Secondary | ICD-10-CM

## 2019-07-06 MED ORDER — NORETHINDRONE 0.35 MG PO TABS: 1.0000 | ORAL_TABLET | Freq: Every day | ORAL | 3 refills | Status: DC

## 2019-07-06 NOTE — Patient Instructions (Signed)

## 2019-09-07 IMAGING — MG DIGITAL DIAGNOSTIC UNILATERAL RIGHT MAMMOGRAM WITH TOMO AND CAD
8 series · 8 of 24 positions shown · non-contrast
Comparison: 09/06/2018 and earlier

CLINICAL DATA: The patient reports swelling beginning in the RIGHT
axillary region and extending along the LATERAL chest wall beyond
the level of the breast.

EXAM:
DIGITAL DIAGNOSTIC RIGHT MAMMOGRAM WITH CAD AND TOMO
ULTRASOUND RIGHT BREAST

[R MLO synth-2D (1 of 3)]
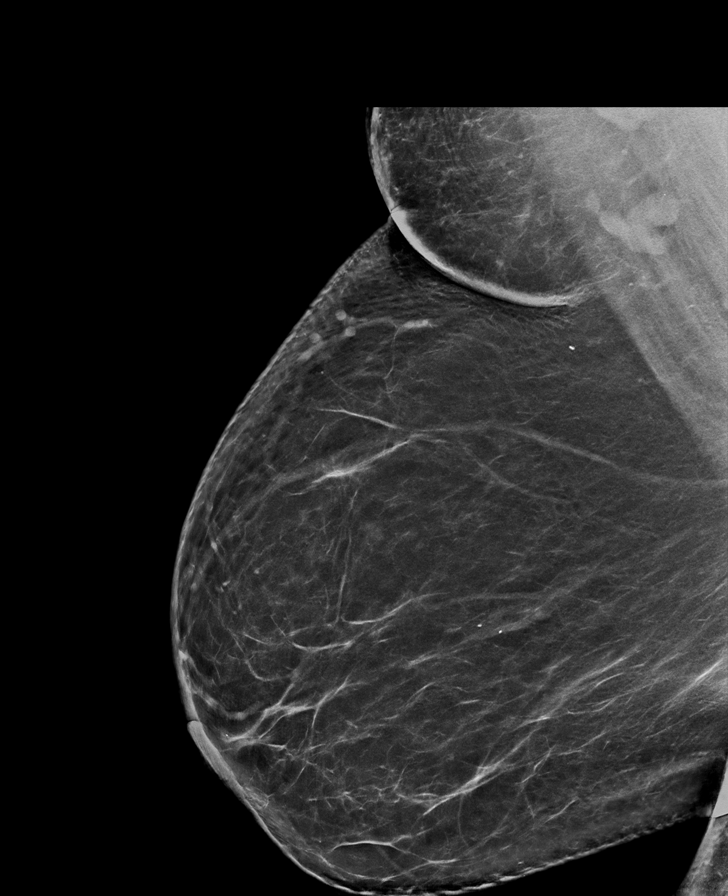

[R MLO synth-2D (2 of 3)]
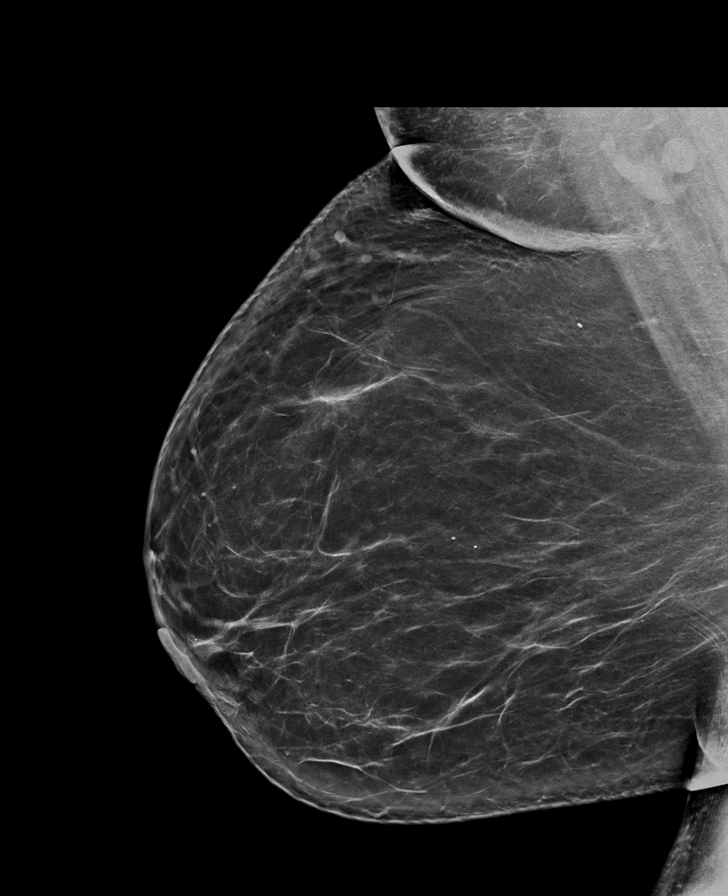

[R CC synth-2D]
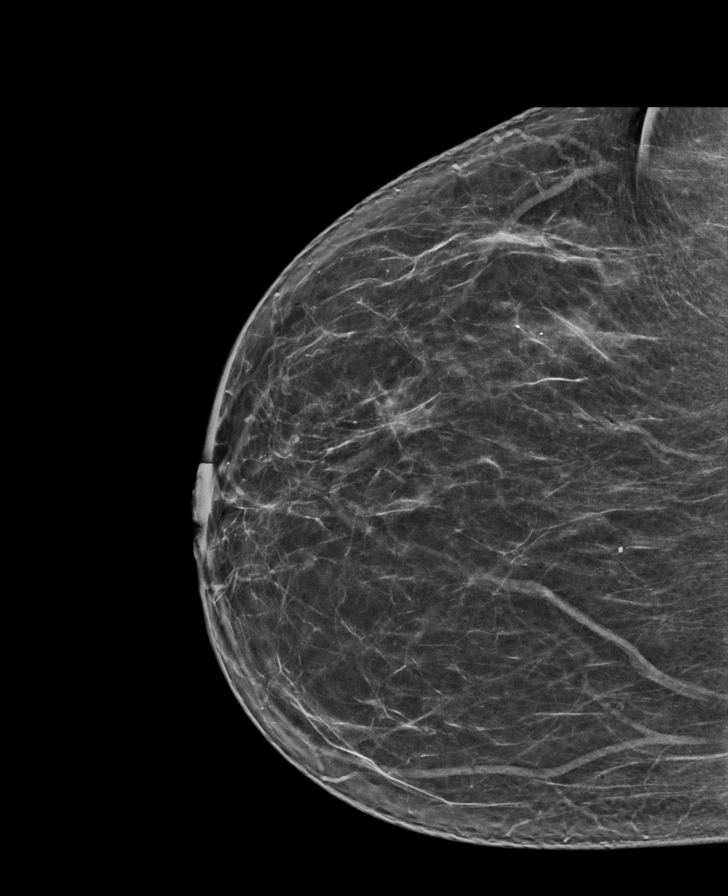

[R MLO synth-2D (3 of 3)]
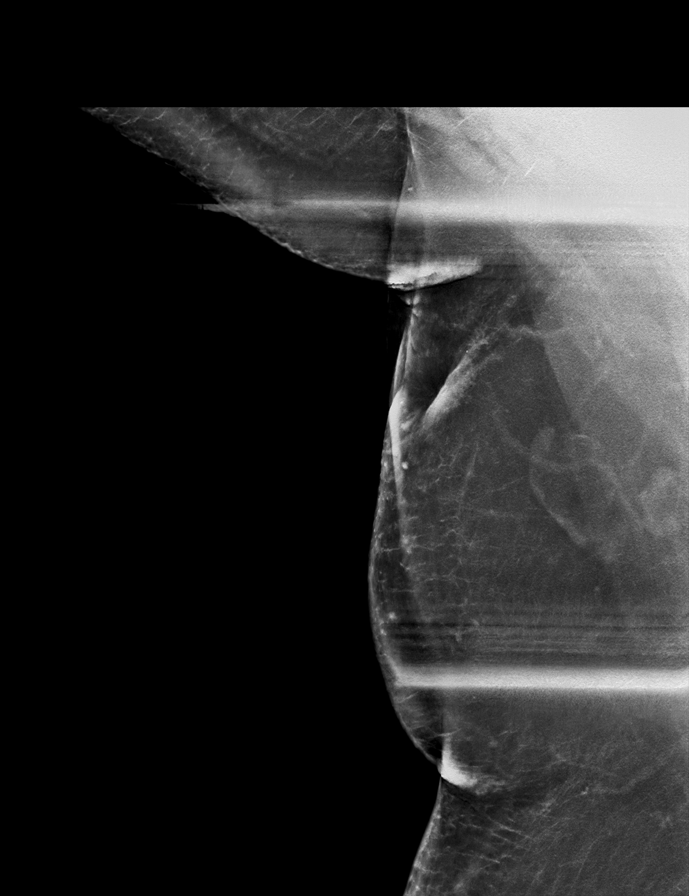

[R MLO tomo (1 of 3) · tomo slice 58/115.0]
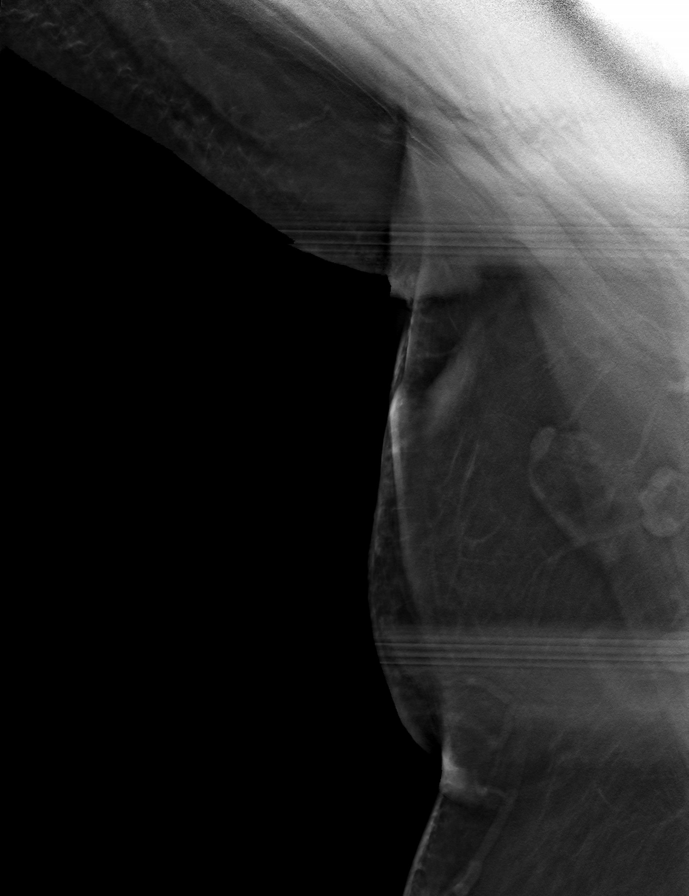

[R CC tomo · tomo slice 39/78.0]
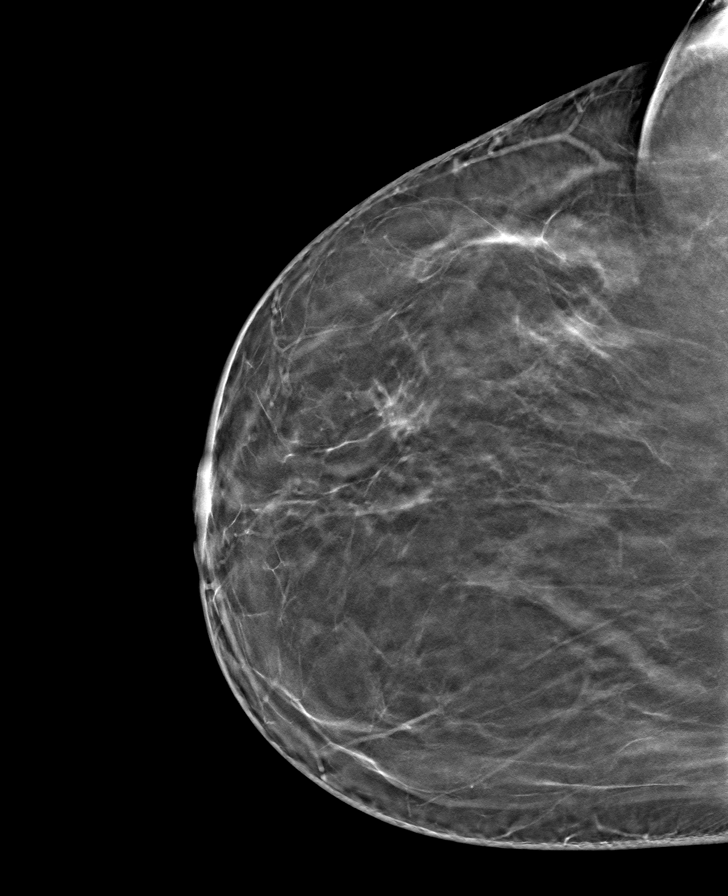

[R MLO tomo (2 of 3) · tomo slice 51/102.0]
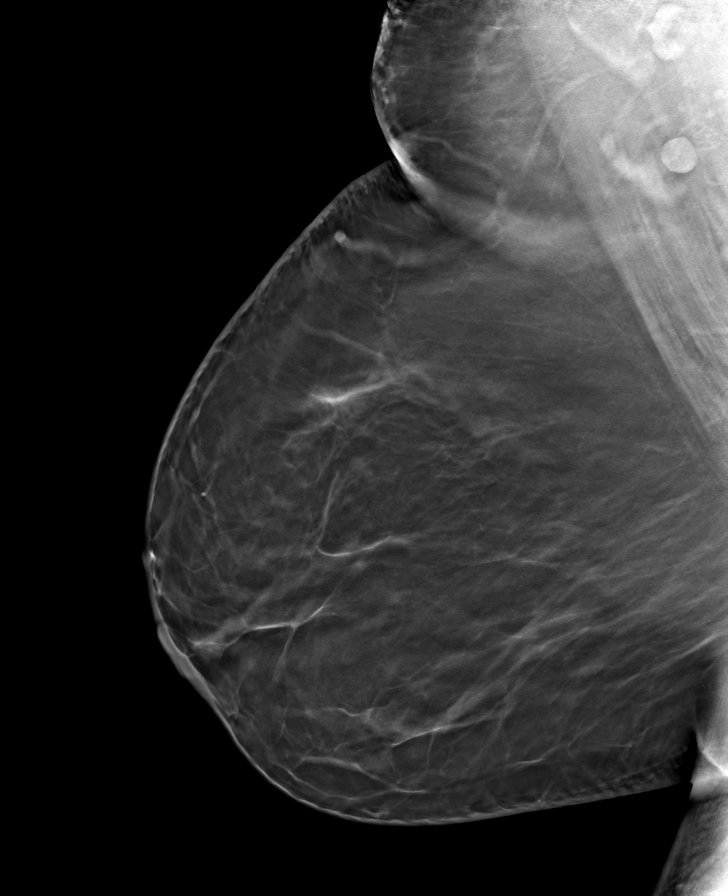

[R MLO tomo (3 of 3) · tomo slice 53/104.0]
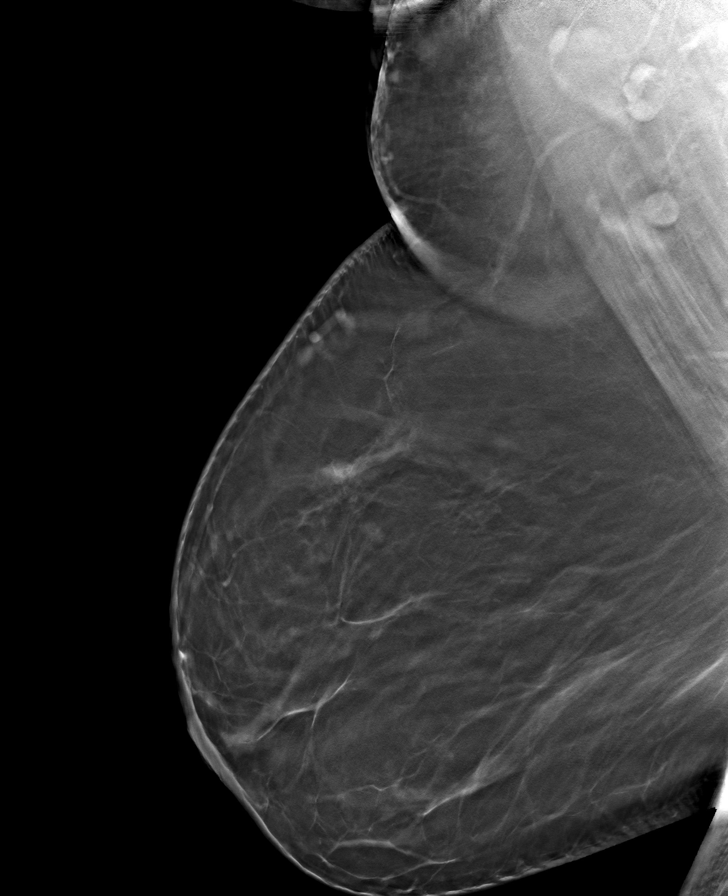

[8 of 24 positions shown; findings below may reference images not displayed]

ACR Breast Density Category b: There are scattered areas of
fibroglandular density.
FINDINGS: No suspicious mass, distortion, or microcalcifications are
identified to suggest presence of malignancy. Spot tangential view
of the RIGHT axilla shows lymph nodes with normal morphology. No
suspicious mass or distortion identified in the RIGHT breast.

Mammographic images were processed with CAD.

On physical exam, there is asymmetry of the axillary fat pad, RIGHT
greater than LEFT when the patient is sitting and raises are arms
overhead. I do not palpate any abnormality in the RIGHT axilla in
the area of patient's concern.

Targeted ultrasound is performed, showing normal appearing
fibrofatty tissue within the RIGHT LATERAL chest wall, in the area
concern to the patient. The evaluation of the axilla is negative for
adenopathy.
IMPRESSION: No mammographic or ultrasound evidence for malignancy.

RECOMMENDATION:
Screening mammogram is recommended in August 2019.

I have discussed the findings and recommendations with the patient.
Results were also provided in writing at the conclusion of the
visit. If applicable, a reminder letter will be sent to the patient
regarding the next appointment.

BI-RADS CATEGORY  1: Negative.

## 2019-11-30 DIAGNOSIS — I10 Essential (primary) hypertension: Secondary | ICD-10-CM | POA: Diagnosis not present

## 2019-11-30 DIAGNOSIS — E119 Type 2 diabetes mellitus without complications: Secondary | ICD-10-CM | POA: Diagnosis not present

## 2020-05-22 DIAGNOSIS — E1169 Type 2 diabetes mellitus with other specified complication: Secondary | ICD-10-CM | POA: Diagnosis not present

## 2020-05-22 DIAGNOSIS — I10 Essential (primary) hypertension: Secondary | ICD-10-CM | POA: Diagnosis not present

## 2020-05-24 DIAGNOSIS — E1169 Type 2 diabetes mellitus with other specified complication: Secondary | ICD-10-CM | POA: Diagnosis not present

## 2020-05-24 DIAGNOSIS — I10 Essential (primary) hypertension: Secondary | ICD-10-CM | POA: Diagnosis not present

## 2020-05-24 LAB — MICROALBUMIN, URINE: Microalb, Ur: 1.51

## 2020-05-24 LAB — LIPID PANEL
Cholesterol: 149 (ref 0–200)
HDL: 35 (ref 35–70)
LDL Cholesterol: 91
Triglycerides: 124 (ref 40–160)

## 2020-06-05 ENCOUNTER — Other Ambulatory Visit: Payer: Self-pay | Admitting: Obstetrics and Gynecology

## 2020-06-05 DIAGNOSIS — Z01419 Encounter for gynecological examination (general) (routine) without abnormal findings: Secondary | ICD-10-CM

## 2020-06-05 NOTE — Telephone Encounter (Signed)
Medication refill request: sharobel 0.35mg  Last AEX:  07/06/19 Next AEX  07/09/20 Last MMG (if hormonal medication request): 03/18/19 Benign  Refill authorized: 84/0

## 2020-06-06 NOTE — Telephone Encounter (Signed)
Please give patient a reminder to schedule her mammogram if it is not already completed this year.

## 2020-07-09 ENCOUNTER — Ambulatory Visit (INDEPENDENT_AMBULATORY_CARE_PROVIDER_SITE_OTHER): Payer: BC Managed Care – PPO | Admitting: Obstetrics and Gynecology

## 2020-07-09 ENCOUNTER — Encounter: Payer: Self-pay | Admitting: Obstetrics and Gynecology

## 2020-07-09 ENCOUNTER — Other Ambulatory Visit: Payer: Self-pay

## 2020-07-09 VITALS — BP 116/76 | HR 80 | Resp 14 | Ht 65.0 in | Wt 278.0 lb

## 2020-07-09 DIAGNOSIS — Z01419 Encounter for gynecological examination (general) (routine) without abnormal findings: Secondary | ICD-10-CM

## 2020-07-09 DIAGNOSIS — Z1211 Encounter for screening for malignant neoplasm of colon: Secondary | ICD-10-CM | POA: Diagnosis not present

## 2020-07-09 MED ORDER — NORETHINDRONE 0.35 MG PO TABS: 1.0000 | ORAL_TABLET | Freq: Every day | ORAL | 0 refills | Status: DC

## 2020-07-09 NOTE — Patient Instructions (Signed)

## 2020-07-09 NOTE — Progress Notes (Signed)
49 y.o. G2P2 Married Caucasian female here for annual exam.    No menses since May, 2021.  Prior to this, had minor light periods. Some hot flashes at night, manageable.  Able to sleep.  No hot flashes during the day.  On Micronor.   A1C is below 7.  Working from home.  Is an Theatre manager.  Has a grand daughter, 7 mo.  Completed Pfizer Covid vaccine, completed in May.   Has not done Flu vaccine.   PCP: Milagros Evener, MD    Patient's last menstrual period was 02/18/2020 (within days).           Sexually active: Yes.    The current method of family planning is tubal ligation and Sharobel.    Exercising: No.  The patient does not participate in regular exercise at present. Smoker:  Former  Health Maintenance: Pap: 03-22-18 Neg:Neg HR HPV, 12-01-14 Neg:Neg HR HPV History of abnormal Pap:  Yes, after childbirth years ago per patient MMG: 03-18-19 Diag.Rt.w/Rt.US/Neg/BiRads1 Colonoscopy:  04-04-18 Neg IFOB. BMD:   n/a  Result  n/a TDaP:  12-01-14 Gardasil:   no HIV: 09-11-14 NR Hep C: no Screening Labs:  PCP   reports that she has quit smoking. She has never used smokeless tobacco. She reports previous alcohol use. She reports that she does not use drugs.  Past Medical History:  Diagnosis Date   Cancer ([place]) 2019   Basal cell left ear   Cervical stenosis (uterine cervix) 03/26/2017   Elevated hemoglobin A1c 03/10/2017   level 7.1   Hypertension     Past Surgical History:  Procedure Laterality Date   APPENDECTOMY  1984   EXTERNAL EAR SURGERY Left 2019   basal cell removal   TUBAL LIGATION  1197/1998    Current Outpatient Medications  Medication Sig Dispense Refill   ibuprofen (ADVIL,MOTRIN) 400 MG tablet Take 800 mg by mouth every 6 (six) hours as needed for mild pain or moderate pain.     lisinopril-hydrochlorothiazide (PRINZIDE,ZESTORETIC) 20-12.5 MG tablet Take 2 tablets by mouth daily.     METFORMIN HCL PO Take 500 mg by mouth daily.       norethindrone (SHAROBEL) 0.35 MG tablet Take 1 tablet (0.35 mg total) by mouth daily. 84 tablet 0   ONETOUCH VERIO test strip USE 1 STRIP TO CHECK GLUCOSE ONCE DAILY  11   No current facility-administered medications for this visit.    Family History  Problem Relation Age of Onset   Breast cancer Maternal Grandmother        Late 70's   Diabetes Other        mother's side   Diabetes Mother    Glaucoma Father     Review of Systems  Constitutional: Negative.   HENT: Negative.   Eyes: Negative.   Respiratory: Negative.   Cardiovascular: Negative.   Gastrointestinal: Negative.   Endocrine: Negative.   Genitourinary: Negative.   Musculoskeletal: Negative.   Skin: Negative.   Allergic/Immunologic: Negative.   Neurological: Negative.   Hematological: Negative.   Psychiatric/Behavioral: Negative.     Exam:   BP 116/76 (BP Location: Left Arm, Patient Position: Sitting, Cuff Size: Large)    Pulse 80    Resp 14    Ht 5\' 5"  (1.651 m)    Wt 278 lb (126.1 kg)    LMP 02/18/2020 (Within Days)    BMI 46.26 kg/m     General appearance: alert, cooperative and appears stated age Head: normocephalic, without obvious abnormality, atraumatic Neck:  no adenopathy, supple, symmetrical, trachea midline and thyroid normal to inspection and palpation Lungs: clear to auscultation bilaterally Breasts: normal appearance, no masses or tenderness, No nipple retraction or dimpling, No nipple discharge or bleeding, No axillary adenopathy Heart: regular rate and rhythm Abdomen: soft, non-tender; no masses, no organomegaly Extremities: extremities normal, atraumatic, no cyanosis or edema Skin: skin color, texture, turgor normal. No rashes or lesions Lymph nodes: cervical, supraclavicular, and axillary nodes normal. Neurologic: grossly normal  Pelvic: External genitalia:  no lesions              No abnormal inguinal nodes palpated.              Urethra:  normal appearing urethra with no masses,  tenderness or lesions              Bartholins and Skenes: normal                 Vagina: normal appearing vagina with normal color and discharge, no lesions              Cervix: no lesions.  Menstrual flow.               Pap taken: No. Bimanual Exam:  Uterus:  normal size, contour, position, consistency, mobility, non-tender              Adnexa: no mass, fullness, tenderness              Rectal exam: Yes.  .  Confirms.              Anus:  normal sphincter tone, no lesions  Chaperone was present for exam.  Assessment:   Well woman visit with normal exam. On progesterone only pills for dysmenorrhea and menorrhagia.  Status post BTL.  FH breast cancer, MGM.  DM. Colon cancer screening.   Plan: Mammogram screening discussed.  She will update.  Self breast awareness reviewed. Pap and HR HPV as above. Guidelines for Calcium, Vitamin D, regular exercise program including cardiovascular and weight bearing exercise. Micronor refill for 3 months until she can update her mammogram. IFOB. Follow up annually and prn.

## 2020-10-25 DIAGNOSIS — R0989 Other specified symptoms and signs involving the circulatory and respiratory systems: Secondary | ICD-10-CM | POA: Diagnosis not present

## 2020-10-25 DIAGNOSIS — R059 Cough, unspecified: Secondary | ICD-10-CM | POA: Diagnosis not present

## 2020-10-25 DIAGNOSIS — R0683 Snoring: Secondary | ICD-10-CM | POA: Diagnosis not present

## 2020-11-06 DIAGNOSIS — L600 Ingrowing nail: Secondary | ICD-10-CM | POA: Diagnosis not present

## 2020-11-06 DIAGNOSIS — M25774 Osteophyte, right foot: Secondary | ICD-10-CM | POA: Diagnosis not present

## 2020-11-07 DIAGNOSIS — B351 Tinea unguium: Secondary | ICD-10-CM | POA: Diagnosis not present

## 2020-11-12 DIAGNOSIS — B351 Tinea unguium: Secondary | ICD-10-CM | POA: Diagnosis not present

## 2020-11-14 ENCOUNTER — Other Ambulatory Visit: Payer: Self-pay | Admitting: Obstetrics and Gynecology

## 2020-11-14 DIAGNOSIS — Z01419 Encounter for gynecological examination (general) (routine) without abnormal findings: Secondary | ICD-10-CM

## 2020-11-20 ENCOUNTER — Other Ambulatory Visit: Payer: Self-pay | Admitting: Obstetrics and Gynecology

## 2020-11-20 DIAGNOSIS — Z01419 Encounter for gynecological examination (general) (routine) without abnormal findings: Secondary | ICD-10-CM

## 2020-11-22 ENCOUNTER — Telehealth: Payer: Self-pay | Admitting: *Deleted

## 2020-11-22 DIAGNOSIS — Z01419 Encounter for gynecological examination (general) (routine) without abnormal findings: Secondary | ICD-10-CM

## 2020-11-22 MED ORDER — NORETHINDRONE 0.35 MG PO TABS: 1.0000 | ORAL_TABLET | Freq: Every day | ORAL | 0 refills | Status: DC

## 2020-11-22 NOTE — Telephone Encounter (Signed)
Patient called requesting refill on birth control pill. I explained per Dr.Sliva on 07/09/2020 note she will was to schedule her mammogram. Patient verbalized she understood and will call now to schedule. I told her I could sent in 1 pack only for her, but she will need to schedule mammogram now.  Patient verbalize she understood.

## 2020-12-03 ENCOUNTER — Other Ambulatory Visit: Payer: Self-pay | Admitting: Obstetrics and Gynecology

## 2020-12-03 ENCOUNTER — Other Ambulatory Visit: Payer: Self-pay

## 2020-12-03 ENCOUNTER — Ambulatory Visit
Admission: RE | Admit: 2020-12-03 | Discharge: 2020-12-03 | Disposition: A | Discharge status: Home / Self Care | Payer: BC Managed Care – PPO | Source: Ambulatory Visit | Attending: Obstetrics and Gynecology | Admitting: Obstetrics and Gynecology

## 2020-12-03 DIAGNOSIS — Z1231 Encounter for screening mammogram for malignant neoplasm of breast: Secondary | ICD-10-CM

## 2020-12-06 ENCOUNTER — Other Ambulatory Visit: Payer: Self-pay | Admitting: Obstetrics and Gynecology

## 2020-12-06 DIAGNOSIS — R928 Other abnormal and inconclusive findings on diagnostic imaging of breast: Secondary | ICD-10-CM

## 2020-12-10 DIAGNOSIS — B351 Tinea unguium: Secondary | ICD-10-CM | POA: Diagnosis not present

## 2020-12-17 ENCOUNTER — Other Ambulatory Visit: Payer: Self-pay | Admitting: Obstetrics and Gynecology

## 2020-12-17 DIAGNOSIS — Z01419 Encounter for gynecological examination (general) (routine) without abnormal findings: Secondary | ICD-10-CM

## 2020-12-17 NOTE — Telephone Encounter (Signed)
Medication refill request: Jocelyn Lamer  Last AEX:  07-09-20 BS Next AEX: 07-15-21  Last MMG (if hormonal medication request): 12-03-20, has diagnostic imaging scheduled for 12-24-20 Refill authorized: Today, please advise.   Medication pended for #28, 0RF. Please refill if appropriate.

## 2020-12-24 ENCOUNTER — Ambulatory Visit
Admission: RE | Admit: 2020-12-24 | Discharge: 2020-12-24 | Disposition: A | Discharge status: Home / Self Care | Payer: BC Managed Care – PPO | Source: Ambulatory Visit | Attending: Obstetrics and Gynecology | Admitting: Obstetrics and Gynecology

## 2020-12-24 ENCOUNTER — Ambulatory Visit: Payer: BC Managed Care – PPO

## 2020-12-24 ENCOUNTER — Other Ambulatory Visit: Payer: Self-pay

## 2020-12-24 DIAGNOSIS — R922 Inconclusive mammogram: Secondary | ICD-10-CM | POA: Diagnosis not present

## 2020-12-24 DIAGNOSIS — R928 Other abnormal and inconclusive findings on diagnostic imaging of breast: Secondary | ICD-10-CM

## 2021-01-15 DIAGNOSIS — B351 Tinea unguium: Secondary | ICD-10-CM | POA: Diagnosis not present

## 2021-01-17 ENCOUNTER — Other Ambulatory Visit: Payer: Self-pay | Admitting: Obstetrics and Gynecology

## 2021-01-17 DIAGNOSIS — Z01419 Encounter for gynecological examination (general) (routine) without abnormal findings: Secondary | ICD-10-CM

## 2021-01-17 NOTE — Telephone Encounter (Signed)
Medication refill request: sharobel 0.35mg  Last AEX:  11-20-20 Next AEX: not scheduled Last MMG (if hormonal medication request): 12-03-20 bilateral & 12-24-20 left breast category b density birads 1:neg Refill authorized: please approve if appropriate

## 2021-01-25 DIAGNOSIS — M79641 Pain in right hand: Secondary | ICD-10-CM | POA: Diagnosis not present

## 2021-02-04 DIAGNOSIS — R0982 Postnasal drip: Secondary | ICD-10-CM | POA: Diagnosis not present

## 2021-02-04 DIAGNOSIS — R059 Cough, unspecified: Secondary | ICD-10-CM | POA: Diagnosis not present

## 2021-02-04 DIAGNOSIS — H6980 Other specified disorders of Eustachian tube, unspecified ear: Secondary | ICD-10-CM | POA: Diagnosis not present

## 2021-02-06 LAB — HM DIABETES EYE EXAM

## 2021-02-19 ENCOUNTER — Ambulatory Visit: Payer: BC Managed Care – PPO | Admitting: Obstetrics and Gynecology

## 2021-02-19 ENCOUNTER — Other Ambulatory Visit: Payer: Self-pay

## 2021-02-19 ENCOUNTER — Encounter: Payer: Self-pay | Admitting: Obstetrics and Gynecology

## 2021-02-19 VITALS — BP 118/70 | HR 88 | Ht 65.0 in | Wt 280.0 lb

## 2021-02-19 DIAGNOSIS — B351 Tinea unguium: Secondary | ICD-10-CM | POA: Diagnosis not present

## 2021-02-19 DIAGNOSIS — N939 Abnormal uterine and vaginal bleeding, unspecified: Secondary | ICD-10-CM | POA: Diagnosis not present

## 2021-02-19 NOTE — Progress Notes (Signed)
GYNECOLOGY  VISIT   HPI: 50 y.o.   Married  Caucasian  female   G2P2 with Patient's last menstrual period was 12/04/2020 (approximate).   here for vaginal discharge. No itching or burning.   At end of April, she had postcoital bleeding.  Not sure if she had a menstrual cycle.  Thinks she may have been late with her refill of Micronor last month.   This month she was on time with her start and taking it daily on time. With her cycles, she bleeds for just a day usually.   Had been spotting off and on for a month now.  Can be brown or pinkish.   No pelvic pain.   Hx of menorrhagia and dysmenorrhea. Normal pelvic US 03/26/2017. No EMB done due to cervical stenosis. Started Micronor for this reason.   [place] daughter is just over a year old.   GYNECOLOGIC HISTORY: Patient's last menstrual period was 12/04/2020 (approximate). Contraception:  Tubal/Micronor Menopausal hormone therapy: none Last mammogram: 12-03-20 Poss.asymmetry Lt.Br.;Rt.Br.neg. Diag.Lt/Neg/screening 75Yr/BiRads1 Last pap smear:  03-22-18 Neg:Neg HR HPV, 12-01-14 Neg:Neg HR HPV        OB History    Gravida  2   Para  2   Term      Preterm      AB      Living  2     SAB      IAB      Ectopic      Multiple      Live Births                 There are no problems to display for this patient.   Past Medical History:  Diagnosis Date  . Cancer ([place]) 2019   Basal cell left ear  . Cervical stenosis (uterine cervix) 03/26/2017  . Elevated hemoglobin A1c 03/10/2017   level 7.1  . Hypertension     Past Surgical History:  Procedure Laterality Date  . APPENDECTOMY  1984  . EXTERNAL EAR SURGERY Left 2019   basal cell removal  . TUBAL LIGATION  1197/1998    Current Outpatient Medications  Medication Sig Dispense Refill  . Azelastine HCl 137 MCG/SPRAY SOLN 1 puff in each nostril    . fluticasone (FLONASE) 50 MCG/ACT nasal spray SMARTSIG:1-2 Spray(s) Both Nares Daily    . ibuprofen (ADVIL,MOTRIN)  400 MG tablet Take 800 mg by mouth every 6 (six) hours as needed for mild pain or moderate pain.    Marland Kitchen lisinopril-hydrochlorothiazide (PRINZIDE,ZESTORETIC) 20-12.5 MG tablet Take 2 tablets by mouth daily.    Marland Kitchen loratadine (CLARITIN) 10 MG tablet Take 10 mg by mouth daily.    . metFORMIN (GLUCOPHAGE-XR) 500 MG 24 hr tablet Take 500 mg by mouth every morning.    . norethindrone (MICRONOR) 0.35 MG tablet 1 tablet    . OneTouch Delica Lancets 29B MISC See admin instructions.    Glory Rosebush VERIO test strip USE 1 STRIP TO CHECK GLUCOSE ONCE DAILY  11  . terbinafine (LAMISIL) 250 MG tablet Take 250 mg by mouth daily.     No current facility-administered medications for this visit.     ALLERGIES: Patient has no known allergies.  Family History  Problem Relation Age of Onset  . Breast cancer Maternal Grandmother        Late 69's  . Diabetes Other        mother's side  . Diabetes Mother   . Glaucoma Father     Social History  Socioeconomic History  . Marital status: Married    Spouse name: Not on file  . Number of children: Not on file  . Years of education: Not on file  . Highest education level: Not on file  Occupational History  . Not on file  Tobacco Use  . Smoking status: Former Research scientist (life sciences)  . Smokeless tobacco: Never Used  Vaping Use  . Vaping Use: Never used  Substance and Sexual Activity  . Alcohol use: Not Currently    Alcohol/week: 0.0 standard drinks    Comment: rarely  . Drug use: No  . Sexual activity: Yes    Partners: Male    Birth control/protection: Surgical    Comment: Tubal Ligation  Other Topics Concern  . Not on file  Social History Narrative  . Not on file   Social Determinants of Health   Financial Resource Strain: Not on file  Food Insecurity: Not on file  Transportation Needs: Not on file  Physical Activity: Not on file  Stress: Not on file  Social Connections: Not on file  Intimate Partner Violence: Not on file    Review of Systems  All other  systems reviewed and are negative.   PHYSICAL EXAMINATION:    BP 118/70   Pulse 88   Ht 5\' 5"  (1.651 m)   Wt 280 lb (127 kg)   LMP 12/04/2020 (Approximate)   SpO2 97%   BMI 46.59 kg/m     General appearance: alert, cooperative and appears stated age  Pelvic: External genitalia:  no lesions              Urethra:  normal appearing urethra with no masses, tenderness or lesions              Bartholins and Skenes: normal                 Vagina: normal appearing vagina with normal color and discharge, no lesions              Cervix: no lesions.  Small amount red blod from os.                Bimanual Exam:  Uterus:  normal size, contour, position, consistency, mobility, non-tender              Adnexa: no mass, fullness, tenderness             Chaperone was present for exam.  ASSESSMENT  Abnormal uterine bleeding on Micronor.  Status post BTL.  Hx cervical stenosis.   PLAN  We reviewed her prior US from 2018.  Possible late pill start last month may have contributed to bleeding.  Continue Micronor. Return for pelvic US and possible EMB.  Dilation and curettage briefly mentioned if unable to assess the endometrium.

## 2021-02-26 NOTE — Progress Notes (Deleted)
GYNECOLOGY  VISIT   HPI: 50 y.o.   Married Caucasian female   G2P2 with No LMP recorded.   here for pelvic US and possible EMB.   GYNECOLOGIC HISTORY: Last menstrual period was 12/04/2020 (approximate).   Contraception: Tubal/Micronor Menopausal hormone therapy: none Last mammogram:  12-03-20 Poss.asymmetry Lt.Br.;Rt.Br.neg. Diag.Lt/Neg/screening 30Yr/BiRads1 Last pap smear: 03-22-18 Neg:Neg HR HPV, 12-01-14 Neg:Neg HR HPV        OB History    Gravida  2   Para  2   Term      Preterm      AB      Living  2     SAB      IAB      Ectopic      Multiple      Live Births                 There are no problems to display for this patient.   Past Medical History:  Diagnosis Date  . Cancer ([place]) 2019   Basal cell left ear  . Cervical stenosis (uterine cervix) 03/26/2017  . Elevated hemoglobin A1c 03/10/2017   level 7.1  . Hypertension     Past Surgical History:  Procedure Laterality Date  . APPENDECTOMY  1984  . EXTERNAL EAR SURGERY Left 2019   basal cell removal  . TUBAL LIGATION  1197/1998    Current Outpatient Medications  Medication Sig Dispense Refill  . Azelastine HCl 137 MCG/SPRAY SOLN 1 puff in each nostril    . fluticasone (FLONASE) 50 MCG/ACT nasal spray SMARTSIG:1-2 Spray(s) Both Nares Daily    . ibuprofen (ADVIL,MOTRIN) 400 MG tablet Take 800 mg by mouth every 6 (six) hours as needed for mild pain or moderate pain.    Marland Kitchen lisinopril-hydrochlorothiazide (PRINZIDE,ZESTORETIC) 20-12.5 MG tablet Take 2 tablets by mouth daily.    Marland Kitchen loratadine (CLARITIN) 10 MG tablet Take 10 mg by mouth daily.    . metFORMIN (GLUCOPHAGE-XR) 500 MG 24 hr tablet Take 500 mg by mouth every morning.    . norethindrone (MICRONOR) 0.35 MG tablet 1 tablet    . OneTouch Delica Lancets 59D MISC See admin instructions.    Glory Rosebush VERIO test strip USE 1 STRIP TO CHECK GLUCOSE ONCE DAILY  11  . terbinafine (LAMISIL) 250 MG tablet Take 250 mg by mouth daily.     No current  facility-administered medications for this visit.     ALLERGIES: Patient has no known allergies.  Family History  Problem Relation Age of Onset  . Breast cancer Maternal Grandmother        Late 43's  . Diabetes Other        mother's side  . Diabetes Mother   . Glaucoma Father     Social History   Socioeconomic History  . Marital status: Married    Spouse name: Not on file  . Number of children: Not on file  . Years of education: Not on file  . Highest education level: Not on file  Occupational History  . Not on file  Tobacco Use  . Smoking status: Former Research scientist (life sciences)  . Smokeless tobacco: Never Used  Vaping Use  . Vaping Use: Never used  Substance and Sexual Activity  . Alcohol use: Not Currently    Alcohol/week: 0.0 standard drinks    Comment: rarely  . Drug use: No  . Sexual activity: Yes    Partners: Male    Birth control/protection: Surgical    Comment: Tubal Ligation  Other Topics Concern  . Not on file  Social History Narrative  . Not on file   Social Determinants of Health   Financial Resource Strain: Not on file  Food Insecurity: Not on file  Transportation Needs: Not on file  Physical Activity: Not on file  Stress: Not on file  Social Connections: Not on file  Intimate Partner Violence: Not on file    Review of Systems  PHYSICAL EXAMINATION:    There were no vitals taken for this visit.    General appearance: alert, cooperative and appears stated age Head: Normocephalic, without obvious abnormality, atraumatic Neck: no adenopathy, supple, symmetrical, trachea midline and thyroid normal to inspection and palpation Lungs: clear to auscultation bilaterally Breasts: normal appearance, no masses or tenderness, No nipple retraction or dimpling, No nipple discharge or bleeding, No axillary or supraclavicular adenopathy Heart: regular rate and rhythm Abdomen: soft, non-tender, no masses,  no organomegaly Extremities: extremities normal, atraumatic, no  cyanosis or edema Skin: Skin color, texture, turgor normal. No rashes or lesions Lymph nodes: Cervical, supraclavicular, and axillary nodes normal. No abnormal inguinal nodes palpated Neurologic: Grossly normal  Pelvic: External genitalia:  no lesions              Urethra:  normal appearing urethra with no masses, tenderness or lesions              Bartholins and Skenes: normal                 Vagina: normal appearing vagina with normal color and discharge, no lesions              Cervix: no lesions                Bimanual Exam:  Uterus:  normal size, contour, position, consistency, mobility, non-tender              Adnexa: no mass, fullness, tenderness              Rectal exam: {yes no:[id number]}.  Confirms.              Anus:  normal sphincter tone, no lesions  Chaperone was present for exam.  ASSESSMENT     PLAN     An After Visit Summary was printed and given to the patient.  ______ minutes face to face time of which over 50% was spent in counseling.

## 2021-03-05 ENCOUNTER — Other Ambulatory Visit: Payer: BC Managed Care – PPO

## 2021-03-05 ENCOUNTER — Other Ambulatory Visit: Payer: BC Managed Care – PPO | Admitting: Obstetrics and Gynecology

## 2021-03-12 ENCOUNTER — Ambulatory Visit (INDEPENDENT_AMBULATORY_CARE_PROVIDER_SITE_OTHER): Payer: BC Managed Care – PPO | Admitting: Obstetrics and Gynecology

## 2021-03-12 ENCOUNTER — Other Ambulatory Visit: Payer: Self-pay

## 2021-03-12 ENCOUNTER — Other Ambulatory Visit: Payer: Self-pay | Admitting: Internal Medicine

## 2021-03-12 ENCOUNTER — Ambulatory Visit (INDEPENDENT_AMBULATORY_CARE_PROVIDER_SITE_OTHER): Payer: BC Managed Care – PPO

## 2021-03-12 ENCOUNTER — Other Ambulatory Visit: Payer: Self-pay | Admitting: Obstetrics and Gynecology

## 2021-03-12 ENCOUNTER — Ambulatory Visit: Payer: BC Managed Care – PPO | Admitting: Internal Medicine

## 2021-03-12 ENCOUNTER — Encounter: Payer: Self-pay | Admitting: Obstetrics and Gynecology

## 2021-03-12 ENCOUNTER — Encounter: Payer: Self-pay | Admitting: Internal Medicine

## 2021-03-12 VITALS — BP 126/88 | HR 89 | Temp 98.5°F | Resp 16 | Ht 66.0 in | Wt 286.0 lb

## 2021-03-12 DIAGNOSIS — J0100 Acute maxillary sinusitis, unspecified: Secondary | ICD-10-CM | POA: Diagnosis not present

## 2021-03-12 DIAGNOSIS — Z1211 Encounter for screening for malignant neoplasm of colon: Secondary | ICD-10-CM

## 2021-03-12 DIAGNOSIS — I10 Essential (primary) hypertension: Secondary | ICD-10-CM | POA: Diagnosis not present

## 2021-03-12 DIAGNOSIS — N939 Abnormal uterine and vaginal bleeding, unspecified: Secondary | ICD-10-CM | POA: Diagnosis not present

## 2021-03-12 DIAGNOSIS — J301 Allergic rhinitis due to pollen: Secondary | ICD-10-CM | POA: Diagnosis not present

## 2021-03-12 DIAGNOSIS — R0600 Dyspnea, unspecified: Secondary | ICD-10-CM

## 2021-03-12 DIAGNOSIS — E785 Hyperlipidemia, unspecified: Secondary | ICD-10-CM | POA: Insufficient documentation

## 2021-03-12 DIAGNOSIS — E118 Type 2 diabetes mellitus with unspecified complications: Secondary | ICD-10-CM | POA: Diagnosis not present

## 2021-03-12 DIAGNOSIS — R0609 Other forms of dyspnea: Secondary | ICD-10-CM | POA: Insufficient documentation

## 2021-03-12 LAB — BASIC METABOLIC PANEL
BUN: 12 mg/dL (ref 6–23)
CO2: 29 mEq/L (ref 19–32)
Calcium: 9.5 mg/dL (ref 8.4–10.5)
Chloride: 99 mEq/L (ref 96–112)
Creatinine, Ser: 0.78 mg/dL (ref 0.40–1.20)
GFR: 87.63 mL/min (ref >60.00)
Glucose, Bld: 204 mg/dL — ABNORMAL HIGH (ref 70–99)
Potassium: 3.6 mEq/L (ref 3.5–5.1)
Sodium: 136 mEq/L (ref 135–145)

## 2021-03-12 LAB — BRAIN NATRIURETIC PEPTIDE: Pro B Natriuretic peptide (BNP): 15 pg/mL (ref 0.0–100.0)

## 2021-03-12 LAB — CBC WITH DIFFERENTIAL/PLATELET
Basophils Absolute: 0 10*3/uL (ref 0.0–0.1)
Basophils Relative: 0.6 % (ref 0.0–3.0)
Eosinophils Absolute: 0.1 10*3/uL (ref 0.0–0.7)
Eosinophils Relative: 1.2 % (ref 0.0–5.0)
HCT: 35.3 % — ABNORMAL LOW (ref 36.0–46.0)
Hemoglobin: 12.5 g/dL (ref 12.0–15.0)
Lymphocytes Relative: 33.8 % (ref 12.0–46.0)
Lymphs Abs: 2 10*3/uL (ref 0.7–4.0)
MCHC: 35.4 g/dL (ref 30.0–36.0)
MCV: 85.8 fl (ref 78.0–100.0)
Monocytes Absolute: 0.4 10*3/uL (ref 0.1–1.0)
Monocytes Relative: 6.1 % (ref 3.0–12.0)
Neutro Abs: 3.4 10*3/uL (ref 1.4–7.7)
Neutrophils Relative %: 58.3 % (ref 43.0–77.0)
Platelets: 204 10*3/uL (ref 150.0–400.0)
RBC: 4.11 Mil/uL (ref 3.87–5.11)
RDW: 14.2 % (ref 11.5–15.5)
WBC: 5.8 10*3/uL (ref 4.0–10.5)

## 2021-03-12 LAB — TROPONIN I (HIGH SENSITIVITY): High Sens Troponin I: 4 ng/L (ref 2–17)

## 2021-03-12 LAB — HEMOGLOBIN A1C: Hgb A1c MFr Bld: 8.7 % — ABNORMAL HIGH (ref 4.6–6.5)

## 2021-03-12 LAB — TSH: TSH: 2.14 u[IU]/mL (ref 0.35–4.50)

## 2021-03-12 MED ORDER — AZELASTINE HCL 137 MCG/SPRAY NA SOLN: 1.0000 | Freq: Two times a day (BID) | NASAL | 1 refills | Status: DC

## 2021-03-12 MED ORDER — LEVOCETIRIZINE DIHYDROCHLORIDE 5 MG PO TABS: 10.0000 mg | ORAL_TABLET | Freq: Every evening | ORAL | 1 refills | Status: DC

## 2021-03-12 MED ORDER — AMOXICILLIN-POT CLAVULANATE 875-125 MG PO TABS: 1.0000 | ORAL_TABLET | Freq: Two times a day (BID) | ORAL | 0 refills | Status: AC

## 2021-03-12 MED ORDER — ROSUVASTATIN CALCIUM 5 MG PO TABS: 5.0000 mg | ORAL_TABLET | Freq: Every day | ORAL | 1 refills | Status: DC

## 2021-03-12 MED ORDER — TIRZEPATIDE 2.5 MG/0.5ML ~~LOC~~ SOAJ: 2.5000 mg | SUBCUTANEOUS | 0 refills | Status: DC

## 2021-03-12 MED ORDER — MONTELUKAST SODIUM 10 MG PO TABS: 10.0000 mg | ORAL_TABLET | Freq: Every day | ORAL | 1 refills | Status: DC

## 2021-03-12 NOTE — Progress Notes (Signed)
Mounjaro doe  Subjective:  Patient ID: Suzanne Bartlett, female    DOB: 06-15-1971  Age: 50 y.o. MRN: [id number]  CC: New Patient (Initial Visit), Annual Exam, Hypertension, Diabetes, and Hyperlipidemia  This visit occurred during the SARS-CoV-2 public health emergency.  Safety protocols were in place, including screening questions prior to the visit, additional usage of staff PPE, and extensive cleaning of exam room while observing appropriate contact time as indicated for disinfecting solutions.    HPI Suzanne Bartlett presents for a CPX, f/up, and to establish.  She complains of a 6-week history of sneezing, nasal phlegm that is clear, right facial pain, postnasal drip that causes nonproductive cough and dyspnea on exertion.  She denies chest pain, diaphoresis, palpitations, edema, dizziness, or lightheadedness.  History Suzanne Bartlett has a past medical history of Cancer ([place]) (2019), Cervical stenosis (uterine cervix) (03/26/2017), Elevated hemoglobin A1c (03/10/2017), and Hypertension.   She has a past surgical history that includes Tubal ligation (1197/1998); Appendectomy (1984); and External ear surgery (Left, 2019).   Her family history includes Breast cancer in her maternal grandmother; Diabetes in her mother and another family member; Glaucoma in her father.She reports that she has quit smoking. She has never used smokeless tobacco. She reports previous alcohol use. She reports that she does not use drugs.  Outpatient Medications Prior to Visit  Medication Sig Dispense Refill   lisinopril-hydrochlorothiazide (PRINZIDE,ZESTORETIC) 20-12.5 MG tablet Take 2 tablets by mouth daily.     metFORMIN (GLUCOPHAGE-XR) 500 MG 24 hr tablet Take 500 mg by mouth every morning.     norethindrone (MICRONOR) 0.35 MG tablet 1 tablet     OneTouch Delica Lancets 72Z MISC See admin instructions.     ONETOUCH VERIO test strip USE 1 STRIP TO CHECK GLUCOSE ONCE DAILY  11   Azelastine HCl 137 MCG/SPRAY  SOLN 1 puff in each nostril     ibuprofen (ADVIL,MOTRIN) 400 MG tablet Take 800 mg by mouth every 6 (six) hours as needed for mild pain or moderate pain.     loratadine (CLARITIN) 10 MG tablet Take 10 mg by mouth daily.     fluticasone (FLONASE) 50 MCG/ACT nasal spray SMARTSIG:1-2 Spray(s) Both Nares Daily     terbinafine (LAMISIL) 250 MG tablet Take 250 mg by mouth daily.     No facility-administered medications prior to visit.    ROS Review of Systems  Constitutional:  Positive for unexpected weight change (wt gain). Negative for appetite change, chills, diaphoresis and fatigue.  HENT:  Positive for postnasal drip, rhinorrhea, sinus pressure, sinus pain, sneezing and trouble swallowing. Negative for congestion, facial swelling, hearing loss, nosebleeds and sore throat.   Eyes:  Negative for discharge.  Respiratory:  Positive for shortness of breath (DOE). Negative for chest tightness, wheezing and stridor.   Cardiovascular:  Negative for chest pain, palpitations and leg swelling.  Gastrointestinal:  Negative for abdominal pain, constipation, diarrhea and nausea.  Genitourinary: Negative.  Negative for difficulty urinating.  Musculoskeletal: Negative.  Negative for arthralgias and myalgias.  Skin: Negative.  Negative for rash.  Neurological: Negative.  Negative for dizziness, weakness, light-headedness and headaches.  Hematological:  Negative for adenopathy. Does not bruise/bleed easily.  Psychiatric/Behavioral: Negative.     Objective:  BP 126/88 (BP Location: Right Arm, Patient Position: Sitting, Cuff Size: Large)   Pulse 89   Temp 98.5 F (36.9 C) (Oral)   Resp 16   Ht 5\' 6"  (1.676 m)   Wt 286 lb (129.7 kg)   SpO2 98%  BMI 46.16 kg/m   Physical Exam Vitals reviewed.  Constitutional:      Appearance: She is obese.  HENT:     Right Ear: Tympanic membrane normal.     Left Ear: Tympanic membrane normal.     Nose: Mucosal edema and rhinorrhea present. No congestion.  Rhinorrhea is clear.     Right Turbinates: Enlarged. Not swollen or pale.     Left Turbinates: Enlarged. Not swollen or pale.     Right Sinus: No maxillary sinus tenderness or frontal sinus tenderness.     Left Sinus: No maxillary sinus tenderness or frontal sinus tenderness.     Mouth/Throat:     Pharynx: Oropharynx is clear.  Eyes:     General: No scleral icterus.    Conjunctiva/sclera: Conjunctivae normal.  Cardiovascular:     Rate and Rhythm: Normal rate and regular rhythm.     Heart sounds: No murmur heard.    Comments: EKG- NSR, 76 bpm Normal EKG Pulmonary:     Effort: Pulmonary effort is normal.     Breath sounds: No stridor. No wheezing, rhonchi or rales.  Abdominal:     General: Abdomen is flat and protuberant.     Palpations: There is no mass.     Tenderness: There is no abdominal tenderness. There is no guarding.     Hernia: No hernia is present.  Musculoskeletal:        General: Normal range of motion.     Cervical back: Neck supple.     Right lower leg: No edema.     Left lower leg: No edema.  Lymphadenopathy:     Cervical: No cervical adenopathy.  Skin:    General: Skin is warm and dry.     Coloration: Skin is not pale.  Neurological:     General: No focal deficit present.     Mental Status: She is alert.  Psychiatric:        Mood and Affect: Mood normal.        Behavior: Behavior normal.    Lab Results  Component Value Date   WBC 5.8 03/12/2021   HGB 12.5 03/12/2021   HCT 35.3 (L) 03/12/2021   PLT 204.0 03/12/2021   GLUCOSE 204 (H) 03/12/2021   CHOL 149 05/24/2020   TRIG 124 05/24/2020   HDL 35 05/24/2020   LDLCALC 91 05/24/2020   ALT 13 09/11/2014   AST 13 09/11/2014   NA 136 03/12/2021   K 3.6 03/12/2021   CL 99 03/12/2021   CREATININE 0.78 03/12/2021   BUN 12 03/12/2021   CO2 29 03/12/2021   TSH 2.14 03/12/2021   HGBA1C 8.7 (H) 03/12/2021   MICROALBUR 1.51 05/24/2020     Assessment & Plan:   Suzanne Bartlett was seen today for new patient  (initial visit), annual exam, hypertension, diabetes and hyperlipidemia.  Diagnoses and all orders for this visit:  Type II diabetes mellitus with manifestations ([place])- Her A1C is up tp 8.7%. Will add a GLP/GIL ag to the metformin. -     Basic metabolic panel; Future -     Hemoglobin A1c; Future -     HM Diabetes Foot Exam -     Hemoglobin A1c -     Basic metabolic panel -     tirzepatide (MOUNJARO) 2.5 MG/0.5ML Pen; Inject 2.5 mg into the skin once a week.  DOE (dyspnea on exertion)- The work-up for this is reassuring.  I think the DOE is related to obesity and poor conditioning. -  EKG 12-Lead -     CBC with Differential/Platelet; Future -     Troponin I (High Sensitivity); Future -     Brain natriuretic peptide; Future -     Brain natriuretic peptide -     Troponin I (High Sensitivity) -     CBC with Differential/Platelet  Acute non-recurrent maxillary sinusitis -     amoxicillin-clavulanate (AUGMENTIN) 875-125 MG tablet; Take 1 tablet by mouth 2 (two) times daily for 10 days. -     CBC with Differential/Platelet; Future -     CBC with Differential/Platelet  Seasonal allergic rhinitis due to pollen -     montelukast (SINGULAIR) 10 MG tablet; Take 1 tablet (10 mg total) by mouth at bedtime. -     Discontinue: levocetirizine (XYZAL) 5 MG tablet; Take 2 tablets (10 mg total) by mouth every evening. -     Azelastine HCl 137 MCG/SPRAY SOLN; Place 1 puff into the nose in the morning and at bedtime.  Primary hypertension- Her blood pressure is adequately well controlled. -     TSH; Future -     TSH  Hyperlipidemia LDL goal <100- I recommended that she take a statin for cardiovascular risk reduction. -     rosuvastatin (CRESTOR) 5 MG tablet; Take 1 tablet (5 mg total) by mouth daily.  Colon cancer screening -     Cologuard  I have discontinued Suzanne Bartlett. Friend's ibuprofen, terbinafine, fluticasone, and loratadine. I have also changed her Azelastine HCl. Additionally, I am  having her start on montelukast, amoxicillin-clavulanate, tirzepatide, and rosuvastatin. Lastly, I am having her maintain her lisinopril-hydrochlorothiazide, OneTouch Verio, norethindrone, metFORMIN, and OneTouch Delica Lancets 42P.  Meds ordered this encounter  Medications   montelukast (SINGULAIR) 10 MG tablet    Sig: Take 1 tablet (10 mg total) by mouth at bedtime.    Dispense:  90 tablet    Refill:  1   DISCONTD: levocetirizine (XYZAL) 5 MG tablet    Sig: Take 2 tablets (10 mg total) by mouth every evening.    Dispense:  180 tablet    Refill:  1   amoxicillin-clavulanate (AUGMENTIN) 875-125 MG tablet    Sig: Take 1 tablet by mouth 2 (two) times daily for 10 days.    Dispense:  20 tablet    Refill:  0   Azelastine HCl 137 MCG/SPRAY SOLN    Sig: Place 1 puff into the nose in the morning and at bedtime.    Dispense:  90 mL    Refill:  1   tirzepatide (MOUNJARO) 2.5 MG/0.5ML Pen    Sig: Inject 2.5 mg into the skin once a week.    Dispense:  2 mL    Refill:  0   rosuvastatin (CRESTOR) 5 MG tablet    Sig: Take 1 tablet (5 mg total) by mouth daily.    Dispense:  90 tablet    Refill:  1     Follow-up: Return in about 3 months (around 06/12/2021).  Scarlette Calico, MD

## 2021-03-12 NOTE — Progress Notes (Signed)
GYNECOLOGY  VISIT   HPI: 50 y.o.   Married  Caucasian  female   G2P2 with No LMP recorded. (Menstrual status: Oral contraceptives).   here for pelvic ultrasound and possible endometrial biopsy.   Bleeding and spotting for the month of May, 2022.  Stopped bleeding by the time she was here for her visit on 02/19/21.   Hx of menorrhagia and dysmenorrhea, treated with Micronor.  Normal pelvic US 03/26/2017. No EMB done due to cervical stenosis.  Hx BTL.   GYNECOLOGIC HISTORY: No LMP recorded. (Menstrual status: Oral contraceptives). Contraception:  Tubal/Micronor Menopausal hormone therapy:  none Last mammogram:   12-03-20 Poss.asymmetry Lt.Br.;Rt.Br.neg. Diag.Lt/Neg/screening 30Yr/BiRads1 Last pap smear: 03-22-18 Neg:Neg HR HPV, 12-01-14 Neg:Neg HR HPV        OB History     Gravida  2   Para  2   Term      Preterm      AB      Living  2      SAB      IAB      Ectopic      Multiple      Live Births                 Patient Active Problem List   Diagnosis Date Noted   Type II diabetes mellitus with manifestations ([place]) 03/12/2021   DOE (dyspnea on exertion) 03/12/2021   Acute non-recurrent maxillary sinusitis 03/12/2021   Seasonal allergic rhinitis due to pollen 03/12/2021   Hyperlipidemia LDL goal <100 03/12/2021   Primary hypertension 03/12/2021    Past Medical History:  Diagnosis Date   Cancer ([place]) 2019   Basal cell left ear   Cervical stenosis (uterine cervix) 03/26/2017   Elevated hemoglobin A1c 03/10/2017   level 7.1   Hypertension     Past Surgical History:  Procedure Laterality Date   APPENDECTOMY  1984   EXTERNAL EAR SURGERY Left 2019   basal cell removal   TUBAL LIGATION  1197/1998    Current Outpatient Medications  Medication Sig Dispense Refill   amoxicillin-clavulanate (AUGMENTIN) 875-125 MG tablet Take 1 tablet by mouth 2 (two) times daily for 10 days. 20 tablet 0   Azelastine HCl 137 MCG/SPRAY SOLN Place 1 puff into the nose in  the morning and at bedtime. 90 mL 1   levocetirizine (XYZAL) 5 MG tablet TAKE 2 TABLETS BY MOUTH IN THE EVENING 180 tablet 1   lisinopril-hydrochlorothiazide (PRINZIDE,ZESTORETIC) 20-12.5 MG tablet Take 2 tablets by mouth daily.     metFORMIN (GLUCOPHAGE-XR) 500 MG 24 hr tablet Take 500 mg by mouth every morning.     montelukast (SINGULAIR) 10 MG tablet Take 1 tablet (10 mg total) by mouth at bedtime. 90 tablet 1   norethindrone (MICRONOR) 0.35 MG tablet 1 tablet     OneTouch Delica Lancets 35K MISC See admin instructions.     ONETOUCH VERIO test strip USE 1 STRIP TO CHECK GLUCOSE ONCE DAILY  11   rosuvastatin (CRESTOR) 5 MG tablet Take 1 tablet (5 mg total) by mouth daily. 90 tablet 1   tirzepatide (MOUNJARO) 2.5 MG/0.5ML Pen Inject 2.5 mg into the skin once a week. 2 mL 0   No current facility-administered medications for this visit.     ALLERGIES: Patient has no known allergies.  Family History  Problem Relation Age of Onset   Breast cancer Maternal Grandmother        Late 70's   Diabetes Other  mother's side   Diabetes Mother    Glaucoma Father     Social History   Socioeconomic History   Marital status: Married    Spouse name: Not on file   Number of children: Not on file   Years of education: Not on file   Highest education level: Not on file  Occupational History   Not on file  Tobacco Use   Smoking status: Former    Pack years: 0.00   Smokeless tobacco: Never  Vaping Use   Vaping Use: Never used  Substance and Sexual Activity   Alcohol use: Not Currently    Alcohol/week: 0.0 standard drinks    Comment: rarely   Drug use: No   Sexual activity: Yes    Partners: Male    Birth control/protection: Surgical    Comment: Tubal Ligation  Other Topics Concern   Not on file  Social History Narrative   Not on file   Social Determinants of Health   Financial Resource Strain: Not on file  Food Insecurity: Not on file  Transportation Needs: Not on file   Physical Activity: Not on file  Stress: Not on file  Social Connections: Not on file  Intimate Partner Violence: Not on file    Review of Systems  All other systems reviewed and are negative.  PHYSICAL EXAMINATION:    BP 128/82 (Cuff Size: Large)   Pulse 89   Ht 5\' 5"  (1.651 m)   Wt 280 lb (127 kg)   SpO2 91%   BMI 46.59 kg/m     General appearance: alert, cooperative and appears stated age  Pelvic US Uterus 7.29 x 4.55 x 4.1 cm.  No myometrial masses.  EMS 5.81 mm.  No endometrial masses. Left ovary with 2 cm simple cyst.  Right ovary normal.  No adnexal masses.  No free fluid.   ASSESSMENT  Abnormal uterine bleeding on Micronor.  Resolved.  Status post BTL. Hx cervical stenosis.   PLAN  Reassurance regarding normal pelvic ultrasound findings.  Images reviewed with patient.  She will continue with her Micronor.  No endometrial biopsy needed. FU for annual exam and prn.

## 2021-03-12 NOTE — Patient Instructions (Signed)

## 2021-03-13 ENCOUNTER — Encounter: Payer: Self-pay | Admitting: Internal Medicine

## 2021-03-14 NOTE — Telephone Encounter (Signed)
  Per Dr Ronnald Ramp My Chart message Patient calling to request pick up  sample of tirzepatide [facility]) 2.5 MG/0.5ML Pen

## 2021-04-05 ENCOUNTER — Telehealth: Payer: Self-pay | Admitting: Internal Medicine

## 2021-04-05 ENCOUNTER — Other Ambulatory Visit: Payer: Self-pay | Admitting: *Deleted

## 2021-04-05 DIAGNOSIS — J301 Allergic rhinitis due to pollen: Secondary | ICD-10-CM

## 2021-04-05 DIAGNOSIS — E118 Type 2 diabetes mellitus with unspecified complications: Secondary | ICD-10-CM

## 2021-04-05 DIAGNOSIS — E785 Hyperlipidemia, unspecified: Secondary | ICD-10-CM

## 2021-04-05 MED ORDER — AZELASTINE HCL 137 MCG/SPRAY NA SOLN: 1.0000 | Freq: Two times a day (BID) | NASAL | 1 refills | Status: DC

## 2021-04-05 MED ORDER — ROSUVASTATIN CALCIUM 5 MG PO TABS: 5.0000 mg | ORAL_TABLET | Freq: Every day | ORAL | 1 refills | Status: DC

## 2021-04-05 MED ORDER — TIRZEPATIDE 2.5 MG/0.5ML ~~LOC~~ SOAJ: 2.5000 mg | SUBCUTANEOUS | 0 refills | Status: DC

## 2021-04-05 MED ORDER — MONTELUKAST SODIUM 10 MG PO TABS: 10.0000 mg | ORAL_TABLET | Freq: Every day | ORAL | 1 refills | Status: DC

## 2021-04-05 NOTE — Telephone Encounter (Signed)
Rx's have been sent.   Rx's sent on 6/21 were sent the local pharmacy in error.

## 2021-04-05 NOTE — Telephone Encounter (Signed)
[facility] requesting 90 day order for  tirzepatide [facility]) 2.5 MG/0.5ML Pen  montelukast (SINGULAIR) 10 MG tablet  Azelastine HCl 137 MCG/SPRAY SOLN  rosuvastatin (CRESTOR) 5 MG tablet

## 2021-04-05 NOTE — Telephone Encounter (Signed)
[facility] called on patient behalf requesting refill on Micronr 0.35 mg tablet.   Annual exam due scheduled on 07/15/21 Mammogram was done 12/03/20

## 2021-04-06 MED ORDER — NORETHINDRONE 0.35 MG PO TABS: ORAL_TABLET | ORAL | 0 refills | Status: DC

## 2021-04-09 ENCOUNTER — Other Ambulatory Visit: Payer: Self-pay | Admitting: Internal Medicine

## 2021-04-09 DIAGNOSIS — J301 Allergic rhinitis due to pollen: Secondary | ICD-10-CM

## 2021-04-09 DIAGNOSIS — E785 Hyperlipidemia, unspecified: Secondary | ICD-10-CM

## 2021-04-09 DIAGNOSIS — E118 Type 2 diabetes mellitus with unspecified complications: Secondary | ICD-10-CM

## 2021-04-09 MED ORDER — TIRZEPATIDE 5 MG/0.5ML ~~LOC~~ SOAJ: 5.0000 mg | SUBCUTANEOUS | 0 refills | Status: DC

## 2021-04-09 MED ORDER — MONTELUKAST SODIUM 10 MG PO TABS: 10.0000 mg | ORAL_TABLET | Freq: Every day | ORAL | 1 refills | Status: DC

## 2021-04-09 MED ORDER — LEVOCETIRIZINE DIHYDROCHLORIDE 5 MG PO TABS: ORAL_TABLET | ORAL | 1 refills | Status: DC

## 2021-04-09 MED ORDER — ROSUVASTATIN CALCIUM 5 MG PO TABS: 5.0000 mg | ORAL_TABLET | Freq: Every day | ORAL | 1 refills | Status: DC

## 2021-04-09 MED ORDER — AZELASTINE HCL 137 MCG/SPRAY NA SOLN: 1.0000 | Freq: Two times a day (BID) | NASAL | 1 refills | Status: DC

## 2021-04-11 ENCOUNTER — Other Ambulatory Visit: Payer: Self-pay | Admitting: Internal Medicine

## 2021-04-11 DIAGNOSIS — J301 Allergic rhinitis due to pollen: Secondary | ICD-10-CM

## 2021-04-11 MED ORDER — LEVOCETIRIZINE DIHYDROCHLORIDE 5 MG PO TABS: ORAL_TABLET | ORAL | 1 refills | Status: DC

## 2021-05-03 ENCOUNTER — Other Ambulatory Visit: Payer: Self-pay | Admitting: Internal Medicine

## 2021-05-03 ENCOUNTER — Telehealth: Payer: Self-pay

## 2021-05-03 DIAGNOSIS — E118 Type 2 diabetes mellitus with unspecified complications: Secondary | ICD-10-CM

## 2021-05-03 MED ORDER — FREESTYLE LIBRE 2 SENSOR MISC: 1.0000 | Freq: Every day | 5 refills | Status: DC

## 2021-05-03 MED ORDER — FREESTYLE LIBRE 2 READER DEVI: 1.0000 | Freq: Every day | 5 refills | Status: DC

## 2021-05-03 NOTE — Telephone Encounter (Signed)
Pt has been informed that Suzanne Bartlett was Rx'd and sent to pharmacy.

## 2021-05-03 NOTE — Telephone Encounter (Signed)
pt has stated she is new toc and is need of her Lancets, test strips for her ONETOUCH VERIO glucose monitor as she is out.

## 2021-05-21 DIAGNOSIS — B351 Tinea unguium: Secondary | ICD-10-CM | POA: Diagnosis not present

## 2021-06-13 ENCOUNTER — Other Ambulatory Visit: Payer: Self-pay | Admitting: Obstetrics and Gynecology

## 2021-06-13 NOTE — Telephone Encounter (Signed)
Last AEX 07/09/20. Scheduled 07/15/21.  Mammo 12/24/20 normal.

## 2021-06-19 ENCOUNTER — Other Ambulatory Visit: Payer: Self-pay | Admitting: Internal Medicine

## 2021-06-19 ENCOUNTER — Telehealth: Payer: Self-pay | Admitting: Internal Medicine

## 2021-06-19 DIAGNOSIS — J301 Allergic rhinitis due to pollen: Secondary | ICD-10-CM

## 2021-06-19 MED ORDER — LEVOCETIRIZINE DIHYDROCHLORIDE 5 MG PO TABS: 5.0000 mg | ORAL_TABLET | Freq: Every evening | ORAL | 1 refills | Status: DC

## 2021-06-19 NOTE — Telephone Encounter (Signed)
Pharmacy calling to confirm instructions for Xyzal. The want to confirm that patient should be taking 2 pills. They state this is a high dose  Please call pharmacy Ref # [id number]

## 2021-07-01 ENCOUNTER — Other Ambulatory Visit: Payer: Self-pay

## 2021-07-01 ENCOUNTER — Encounter: Payer: Self-pay | Admitting: Internal Medicine

## 2021-07-01 ENCOUNTER — Other Ambulatory Visit: Payer: Self-pay | Admitting: Internal Medicine

## 2021-07-01 ENCOUNTER — Ambulatory Visit (INDEPENDENT_AMBULATORY_CARE_PROVIDER_SITE_OTHER): Payer: BC Managed Care – PPO | Admitting: Internal Medicine

## 2021-07-01 VITALS — BP 136/88 | HR 91 | Temp 98.4°F | Resp 16 | Ht 65.0 in | Wt 263.0 lb

## 2021-07-01 DIAGNOSIS — E118 Type 2 diabetes mellitus with unspecified complications: Secondary | ICD-10-CM

## 2021-07-01 DIAGNOSIS — T502X5A Adverse effect of carbonic-anhydrase inhibitors, benzothiadiazides and other diuretics, initial encounter: Secondary | ICD-10-CM

## 2021-07-01 DIAGNOSIS — Z0000 Encounter for general adult medical examination without abnormal findings: Secondary | ICD-10-CM | POA: Diagnosis not present

## 2021-07-01 DIAGNOSIS — K5904 Chronic idiopathic constipation: Secondary | ICD-10-CM

## 2021-07-01 DIAGNOSIS — I10 Essential (primary) hypertension: Secondary | ICD-10-CM

## 2021-07-01 DIAGNOSIS — Z0001 Encounter for general adult medical examination with abnormal findings: Secondary | ICD-10-CM | POA: Insufficient documentation

## 2021-07-01 DIAGNOSIS — E785 Hyperlipidemia, unspecified: Secondary | ICD-10-CM

## 2021-07-01 DIAGNOSIS — Z1159 Encounter for screening for other viral diseases: Secondary | ICD-10-CM | POA: Insufficient documentation

## 2021-07-01 DIAGNOSIS — E119 Type 2 diabetes mellitus without complications: Secondary | ICD-10-CM | POA: Diagnosis not present

## 2021-07-01 DIAGNOSIS — Z1211 Encounter for screening for malignant neoplasm of colon: Secondary | ICD-10-CM

## 2021-07-01 DIAGNOSIS — E876 Hypokalemia: Secondary | ICD-10-CM

## 2021-07-01 LAB — CBC WITH DIFFERENTIAL/PLATELET
Basophils Absolute: 0 10*3/uL (ref 0.0–0.1)
Basophils Relative: 0.4 % (ref 0.0–3.0)
Eosinophils Absolute: 0.1 10*3/uL (ref 0.0–0.7)
Eosinophils Relative: 1.3 % (ref 0.0–5.0)
HCT: 38.9 % (ref 36.0–46.0)
Hemoglobin: 13.2 g/dL (ref 12.0–15.0)
Lymphocytes Relative: 30.8 % (ref 12.0–46.0)
Lymphs Abs: 2.2 10*3/uL (ref 0.7–4.0)
MCHC: 34 g/dL (ref 30.0–36.0)
MCV: 85.6 fl (ref 78.0–100.0)
Monocytes Absolute: 0.4 10*3/uL (ref 0.1–1.0)
Monocytes Relative: 5.9 % (ref 3.0–12.0)
Neutro Abs: 4.5 10*3/uL (ref 1.4–7.7)
Neutrophils Relative %: 61.6 % (ref 43.0–77.0)
Platelets: 207 10*3/uL (ref 150.0–400.0)
RBC: 4.55 Mil/uL (ref 3.87–5.11)
RDW: 13.9 % (ref 11.5–15.5)
WBC: 7.2 10*3/uL (ref 4.0–10.5)

## 2021-07-01 LAB — LIPID PANEL
Cholesterol: 85 mg/dL (ref 0–200)
HDL: 36.2 mg/dL — ABNORMAL LOW (ref >39.00)
LDL Cholesterol: 28 mg/dL (ref 0–99)
NonHDL: 48.75
Total CHOL/HDL Ratio: 2
Triglycerides: 102 mg/dL (ref 0.0–149.0)
VLDL: 20.4 mg/dL (ref 0.0–40.0)

## 2021-07-01 LAB — MICROALBUMIN / CREATININE URINE RATIO
Creatinine,U: 176.6 mg/dL
Microalb Creat Ratio: 4.9 mg/g (ref 0.0–30.0)
Microalb, Ur: 8.7 mg/dL — ABNORMAL HIGH (ref 0.0–1.9)

## 2021-07-01 LAB — HEMOGLOBIN A1C: Hgb A1c MFr Bld: 6.6 % — ABNORMAL HIGH (ref 4.6–6.5)

## 2021-07-01 LAB — URINALYSIS, ROUTINE W REFLEX MICROSCOPIC
Bilirubin Urine: NEGATIVE
Hgb urine dipstick: NEGATIVE
Ketones, ur: NEGATIVE
Nitrite: NEGATIVE
Specific Gravity, Urine: 1.02 (ref 1.000–1.030)
Urine Glucose: NEGATIVE
Urobilinogen, UA: 0.2 (ref 0.0–1.0)
pH: 6 (ref 5.0–8.0)

## 2021-07-01 LAB — BASIC METABOLIC PANEL
BUN: 12 mg/dL (ref 6–23)
CO2: 30 mEq/L (ref 19–32)
Calcium: 9.7 mg/dL (ref 8.4–10.5)
Chloride: 101 mEq/L (ref 96–112)
Creatinine, Ser: 0.86 mg/dL (ref 0.40–1.20)
GFR: 77.78 mL/min (ref >60.00)
Glucose, Bld: 132 mg/dL — ABNORMAL HIGH (ref 70–99)
Potassium: 3.3 mEq/L — ABNORMAL LOW (ref 3.5–5.1)
Sodium: 140 mEq/L (ref 135–145)

## 2021-07-01 MED ORDER — LISINOPRIL-HYDROCHLOROTHIAZIDE 20-12.5 MG PO TABS: 2.0000 | ORAL_TABLET | Freq: Every day | ORAL | 1 refills | Status: DC

## 2021-07-01 MED ORDER — TIRZEPATIDE 7.5 MG/0.5ML ~~LOC~~ SOAJ: 7.5000 mg | SUBCUTANEOUS | 0 refills | Status: DC

## 2021-07-01 MED ORDER — METFORMIN HCL ER 500 MG PO TB24: 500.0000 mg | ORAL_TABLET | Freq: Every morning | ORAL | 1 refills | Status: DC

## 2021-07-01 MED ORDER — TRULANCE 3 MG PO TABS: 1.0000 | ORAL_TABLET | Freq: Every day | ORAL | 1 refills | Status: DC

## 2021-07-01 NOTE — Patient Instructions (Signed)
Health Maintenance, Female Adopting a healthy lifestyle and getting preventive care are important in promoting health and wellness. Ask your health care provider about: The right schedule for you to have regular tests and exams. Things you can do on your own to prevent diseases and keep yourself healthy. What should I know about diet, weight, and exercise? Eat a healthy diet  Eat a diet that includes plenty of vegetables, fruits, low-fat dairy products, and lean protein. Do not eat a lot of foods that are high in solid fats, added sugars, or sodium. Maintain a healthy weight Body mass index (BMI) is used to identify weight problems. It estimates body fat based on height and weight. Your health care provider can help determine your BMI and help you achieve or maintain a healthy weight. Get regular exercise Get regular exercise. This is one of the most important things you can do for your health. Most adults should: Exercise for at least 150 minutes each week. The exercise should increase your heart rate and make you sweat (moderate-intensity exercise). Do strengthening exercises at least twice a week. This is in addition to the moderate-intensity exercise. Spend less time sitting. Even light physical activity can be beneficial. Watch cholesterol and blood lipids Have your blood tested for lipids and cholesterol at 52 years of age, then have this test every 5 years. Have your cholesterol levels checked more often if: Your lipid or cholesterol levels are high. You are older than 52 years of age. You are at high risk for heart disease. What should I know about cancer screening? Depending on your health history and family history, you may need to have cancer screening at various ages. This may include screening for: Breast cancer. Cervical cancer. Colorectal cancer. Skin cancer. Lung cancer. What should I know about heart disease, diabetes, and high blood pressure? Blood pressure and heart  disease High blood pressure causes heart disease and increases the risk of stroke. This is more likely to develop in people who have high blood pressure readings, are of African descent, or are overweight. Have your blood pressure checked: Every 3-5 years if you are 18-39 years of age. Every year if you are 40 years old or older. Diabetes Have regular diabetes screenings. This checks your fasting blood sugar level. Have the screening done: Once every three years after age 40 if you are at a normal weight and have a low risk for diabetes. More often and at a younger age if you are overweight or have a high risk for diabetes. What should I know about preventing infection? Hepatitis B If you have a higher risk for hepatitis B, you should be screened for this virus. Talk with your health care provider to find out if you are at risk for hepatitis B infection. Hepatitis C Testing is recommended for: Everyone born from 1945 through 1965. Anyone with known risk factors for hepatitis C. Sexually transmitted infections (STIs) Get screened for STIs, including gonorrhea and chlamydia, if: You are sexually active and are younger than 52 years of age. You are older than 52 years of age and your health care provider tells you that you are at risk for this type of infection. Your sexual activity has changed since you were last screened, and you are at increased risk for chlamydia or gonorrhea. Ask your health care provider if you are at risk. Ask your health care provider about whether you are at high risk for HIV. Your health care provider may recommend a prescription medicine   to help prevent HIV infection. If you choose to take medicine to prevent HIV, you should first get tested for HIV. You should then be tested every 3 months for as long as you are taking the medicine. Pregnancy If you are about to stop having your period (premenopausal) and you may become pregnant, seek counseling before you get  pregnant. Take 400 to 800 micrograms (mcg) of folic acid every day if you become pregnant. Ask for birth control (contraception) if you want to prevent pregnancy. Osteoporosis and menopause Osteoporosis is a disease in which the bones lose minerals and strength with aging. This can result in bone fractures. If you are 65 years old or older, or if you are at risk for osteoporosis and fractures, ask your health care provider if you should: Be screened for bone loss. Take a calcium or vitamin D supplement to lower your risk of fractures. Be given hormone replacement therapy (HRT) to treat symptoms of menopause. Follow these instructions at home: Lifestyle Do not use any products that contain nicotine or tobacco, such as cigarettes, e-cigarettes, and chewing tobacco. If you need help quitting, ask your health care provider. Do not use street drugs. Do not share needles. Ask your health care provider for help if you need support or information about quitting drugs. Alcohol use Do not drink alcohol if: Your health care provider tells you not to drink. You are pregnant, may be pregnant, or are planning to become pregnant. If you drink alcohol: Limit how much you use to 0-1 drink a day. Limit intake if you are breastfeeding. Be aware of how much alcohol is in your drink. In the [place] one drink equals one 12 oz bottle of beer (355 mL), one 5 oz glass of wine (148 mL), or one 1 oz glass of hard liquor (44 mL). General instructions Schedule regular health, dental, and eye exams. Stay current with your vaccines. Tell your health care provider if: You often feel depressed. You have ever been abused or do not feel safe at home. Summary Adopting a healthy lifestyle and getting preventive care are important in promoting health and wellness. Follow your health care provider's instructions about healthy diet, exercising, and getting tested or screened for diseases. Follow your health care provider's  instructions on monitoring your cholesterol and blood pressure. This information is not intended to replace advice given to you by your health care provider. Make sure you discuss any questions you have with your health care provider. Document Revised: 11/16/2020 Document Reviewed: 09/01/2018 Elsevier Patient Education  2022 [employer]  

## 2021-07-01 NOTE — Progress Notes (Signed)
[facility]ic   Subjective:  Patient ID: Suzanne Bartlett, female    DOB: Nov 05, 1970  Age: 50 y.o. MRN: [id number]  CC: Annual Exam, Hypertension, Hyperlipidemia, and Diabetes  This visit occurred during the SARS-CoV-2 public health emergency.  Safety protocols were in place, including screening questions prior to the visit, additional usage of staff PPE, and extensive cleaning of exam room while observing appropriate contact time as indicated for disinfecting solutions.    HPI Lachlan Pelto presents for a CPX and f/up -  She has had constipation for many years but since she started using mounjaro the constipation has worsened.  She may go for a week without having a bowel movement.  She has not gotten much symptom relief with MiraLAX.  She has some straining and occasionally passes blood in her stool.  Her blood sugars are much better and she denies polys.  Her blood pressure has been well controlled and she denies headache, blurred vision, chest pain, shortness of breath, or diaphoresis.  Outpatient Medications Prior to Visit  Medication Sig Dispense Refill   Azelastine HCl 137 MCG/SPRAY SOLN Place 1 puff into the nose in the morning and at bedtime. 90 mL 1   Continuous Blood Gluc Receiver (FREESTYLE LIBRE 2 READER) DEVI 1 Act by Does not apply route daily. 2 each 5   Continuous Blood Gluc Sensor (FREESTYLE LIBRE 2 SENSOR) MISC 1 Act by Does not apply route daily. 2 each 5   levocetirizine (XYZAL) 5 MG tablet Take 1 tablet (5 mg total) by mouth every evening. 90 tablet 1   montelukast (SINGULAIR) 10 MG tablet Take 1 tablet (10 mg total) by mouth at bedtime. 90 tablet 1   norethindrone (HEATHER) 0.35 MG tablet TAKE 1 TABLET AS DIRECTED BY PRESCRIBER OR PACKAGE INSTRUCTIONS 84 tablet 0   OneTouch Delica Lancets 33A MISC See admin instructions.     ONETOUCH VERIO test strip USE 1 STRIP TO CHECK GLUCOSE ONCE DAILY  11   rosuvastatin (CRESTOR) 5 MG tablet Take 1 tablet (5 mg total) by mouth  daily. 90 tablet 1   lisinopril-hydrochlorothiazide (PRINZIDE,ZESTORETIC) 20-12.5 MG tablet Take 2 tablets by mouth daily.     metFORMIN (GLUCOPHAGE-XR) 500 MG 24 hr tablet Take 500 mg by mouth every morning.     tirzepatide [facility]) 5 MG/0.5ML Pen Inject 5 mg into the skin once a week. 6 mL 0   No facility-administered medications prior to visit.    ROS Review of Systems  Constitutional:  Negative for chills, diaphoresis, fatigue and fever.  HENT: Negative.    Eyes: Negative.   Respiratory:  Negative for cough, chest tightness, shortness of breath and wheezing.   Cardiovascular:  Negative for chest pain, palpitations and leg swelling.  Gastrointestinal:  Positive for blood in stool and constipation. Negative for abdominal pain, anal bleeding, diarrhea, nausea and vomiting.  Endocrine: Negative.   Genitourinary: Negative.  Negative for difficulty urinating.  Musculoskeletal:  Negative for arthralgias and myalgias.  Skin: Negative.   Neurological:  Negative for dizziness, weakness, light-headedness and headaches.  Hematological:  Negative for adenopathy. Does not bruise/bleed easily.  Psychiatric/Behavioral: Negative.     Objective:  BP 136/88 (BP Location: Left Arm, Patient Position: Sitting, Cuff Size: Large)   Pulse 91   Temp 98.4 F (36.9 C) (Oral)   Resp 16   Ht 5\' 5"  (1.651 m)   Wt 263 lb (119.3 kg)   SpO2 97%   BMI 43.77 kg/m   BP Readings from Last 3 Encounters:  07/01/21 136/88  03/12/21 128/82  03/12/21 126/88    Wt Readings from Last 3 Encounters:  07/01/21 263 lb (119.3 kg)  03/12/21 280 lb (127 kg)  03/12/21 286 lb (129.7 kg)    Physical Exam Vitals reviewed.  Constitutional:      Appearance: She is obese. She is not ill-appearing.  HENT:     Mouth/Throat:     Mouth: Mucous membranes are moist.  Eyes:     General: No scleral icterus.    Conjunctiva/sclera: Conjunctivae normal.  Cardiovascular:     Rate and Rhythm: Normal rate and regular  rhythm.     Heart sounds: No murmur heard. Pulmonary:     Effort: Pulmonary effort is normal.     Breath sounds: No stridor. No wheezing, rhonchi or rales.  Abdominal:     General: Abdomen is protuberant. Bowel sounds are normal. There is no distension.     Palpations: Abdomen is soft. There is no hepatomegaly, splenomegaly or mass.  Musculoskeletal:        General: Normal range of motion.     Cervical back: Neck supple.     Right lower leg: No edema.     Left lower leg: No edema.  Lymphadenopathy:     Cervical: No cervical adenopathy.  Skin:    General: Skin is warm and dry.  Neurological:     General: No focal deficit present.  Psychiatric:        Mood and Affect: Mood normal.    Lab Results  Component Value Date   WBC 7.2 07/01/2021   HGB 13.2 07/01/2021   HCT 38.9 07/01/2021   PLT 207.0 07/01/2021   GLUCOSE 132 (H) 07/01/2021   CHOL 85 07/01/2021   TRIG 102.0 07/01/2021   HDL 36.20 (L) 07/01/2021   LDLCALC 28 07/01/2021   ALT 13 09/11/2014   AST 13 09/11/2014   NA 140 07/01/2021   K 3.3 (L) 07/01/2021   CL 101 07/01/2021   CREATININE 0.86 07/01/2021   BUN 12 07/01/2021   CO2 30 07/01/2021   TSH 2.14 03/12/2021   HGBA1C 6.6 (H) 07/01/2021   MICROALBUR 8.7 (H) 07/01/2021    MM DIAG BREAST TOMO UNI LEFT  Result Date: 12/24/2020 CLINICAL DATA:  Recall from 2D screening mammography, possible focal asymmetry involving the OUTER LEFT breast at POSTERIOR depth. EXAM: DIGITAL DIAGNOSTIC UNILATERAL LEFT MAMMOGRAM WITH TOMOSYNTHESIS TECHNIQUE: Left digital diagnostic mammography and breast tomosynthesis was performed. COMPARISON:  Previous exam(s). ACR Breast Density Category b: There are scattered areas of fibroglandular density. FINDINGS: Spot-compression CC and MLO views area of concern in the OUTER breast were obtained. The focal asymmetry questioned on screening mammography disperses with compression, indicating overlapping fibroglandular tissue. There is no underlying  mass or architectural distortion. IMPRESSION: No mammographic evidence of malignancy involving the LEFT breast. RECOMMENDATION: Screening mammogram in one year.(Code:SM-B-01Y) I have discussed the findings and recommendations with the patient. If applicable, a reminder letter will be sent to the patient regarding the next appointment. BI-RADS CATEGORY  1: Negative. Electronically Signed   By: Evangeline Dakin M.D.   On: 12/24/2020 09:44    Assessment & Plan:   Brynnley was seen today for annual exam, hypertension, hyperlipidemia and diabetes.  Diagnoses and all orders for this visit:  Primary hypertension- Her blood pressure is adequately well controlled.  I will treat the hypokalemia. -     CBC with Differential/Platelet; Future -     Basic metabolic panel; Future -     Urinalysis,  Routine w reflex microscopic; Future -     Discontinue: lisinopril-hydrochlorothiazide (ZESTORETIC) 20-12.5 MG tablet; Take 2 tablets by mouth daily. -     Urinalysis, Routine w reflex microscopic -     Basic metabolic panel -     CBC with Differential/Platelet -     lisinopril-hydrochlorothiazide (ZESTORETIC) 20-12.5 MG tablet; Take 2 tablets by mouth daily. -     potassium chloride SA (KLOR-CON M15) 15 MEQ tablet; Take 1 tablet (15 mEq total) by mouth 2 (two) times daily.  Hyperlipidemia LDL goal <100- LDL goal achieved. Doing well on the statin   Type II diabetes mellitus with manifestations ([place])- Her A1c is down to 6.6%.  We will continue the GLP/GIP agonist and metformin. -     Basic metabolic panel; Future -     Microalbumin / creatinine urine ratio; Future -     Hemoglobin A1c; Future -     Discontinue: lisinopril-hydrochlorothiazide (ZESTORETIC) 20-12.5 MG tablet; Take 2 tablets by mouth daily. -     tirzepatide (MOUNJARO) 7.5 MG/0.5ML Pen; Inject 7.5 mg into the skin once a week. -     Hemoglobin A1c -     Microalbumin / creatinine urine ratio -     Basic metabolic panel -     metFORMIN  (GLUCOPHAGE-XR) 500 MG 24 hr tablet; Take 1 tablet (500 mg total) by mouth every morning. -     lisinopril-hydrochlorothiazide (ZESTORETIC) 20-12.5 MG tablet; Take 2 tablets by mouth daily.  Encounter for general adult medical examination with abnormal findings- Exam completed, labs reviewed, she refused vaccines against influenza and pneumonia, cancer screenings addressed, patient education was given. -     Lipid panel; Future -     Hepatitis C antibody; Future -     Hepatitis C antibody -     Lipid panel  Need for hepatitis C screening test -     Hepatitis C antibody; Future -     Hepatitis C antibody  Screening for colon cancer -     Ambulatory referral to Gastroenterology  Chronic idiopathic constipation -     Plecanatide (TRULANCE) 3 MG TABS; Take 1 tablet by mouth daily.  Diuretic-induced hypokalemia -     potassium chloride SA (KLOR-CON M15) 15 MEQ tablet; Take 1 tablet (15 mEq total) by mouth 2 (two) times daily.  I have discontinued Chauncey Cruel. Matney's tirzepatide. I have also changed her metFORMIN. Additionally, I am having her start on tirzepatide, Trulance, and potassium chloride SA. Lastly, I am having her maintain her OneTouch Verio, OneTouch Delica Lancets 55D, rosuvastatin, Azelastine HCl, montelukast, FreeStyle Libre 2 Sensor, [employer] 2 Reader, norethindrone, levocetirizine, and lisinopril-hydrochlorothiazide.  Meds ordered this encounter  Medications   DISCONTD: lisinopril-hydrochlorothiazide (ZESTORETIC) 20-12.5 MG tablet    Sig: Take 2 tablets by mouth daily.    Dispense:  90 tablet    Refill:  1   tirzepatide (MOUNJARO) 7.5 MG/0.5ML Pen    Sig: Inject 7.5 mg into the skin once a week.    Dispense:  6 mL    Refill:  0   Plecanatide (TRULANCE) 3 MG TABS    Sig: Take 1 tablet by mouth daily.    Dispense:  90 tablet    Refill:  1   metFORMIN (GLUCOPHAGE-XR) 500 MG 24 hr tablet    Sig: Take 1 tablet (500 mg total) by mouth every morning.    Dispense:  90  tablet    Refill:  1   lisinopril-hydrochlorothiazide (ZESTORETIC) 20-12.5 MG  tablet    Sig: Take 2 tablets by mouth daily.    Dispense:  180 tablet    Refill:  1   potassium chloride SA (KLOR-CON M15) 15 MEQ tablet    Sig: Take 1 tablet (15 mEq total) by mouth 2 (two) times daily.    Dispense:  180 tablet    Refill:  0      Follow-up: Return in about 6 months (around 12/30/2021).  Scarlette Calico, MD

## 2021-07-02 DIAGNOSIS — E876 Hypokalemia: Secondary | ICD-10-CM | POA: Insufficient documentation

## 2021-07-02 LAB — HEPATITIS C ANTIBODY
Hepatitis C Ab: NONREACTIVE
SIGNAL TO CUT-OFF: 0.01 (ref <1.00)

## 2021-07-02 MED ORDER — POTASSIUM CHLORIDE CRYS ER 15 MEQ PO TBCR: 15.0000 meq | EXTENDED_RELEASE_TABLET | Freq: Two times a day (BID) | ORAL | 0 refills | Status: DC

## 2021-07-02 MED ORDER — LISINOPRIL-HYDROCHLOROTHIAZIDE 20-12.5 MG PO TABS: 2.0000 | ORAL_TABLET | Freq: Every day | ORAL | 1 refills | Status: DC

## 2021-07-04 ENCOUNTER — Encounter: Payer: Self-pay | Admitting: Gastroenterology

## 2021-07-09 ENCOUNTER — Telehealth: Payer: Self-pay | Admitting: Internal Medicine

## 2021-07-09 ENCOUNTER — Telehealth: Payer: Self-pay

## 2021-07-09 NOTE — Telephone Encounter (Signed)
Key: Suzanne Bartlett

## 2021-07-09 NOTE — Telephone Encounter (Signed)
Patient says pharmacy called her & advised that since the dosage has changed for tirzepatide [facility]) 7.5 MG/0.5ML Pen a new PA is needed   Pharmacy:  [facility] [address] [place] - [place] [address]  Phone:  [phone] Fax:  [phone]

## 2021-07-10 NOTE — Telephone Encounter (Signed)
Key: [id number]

## 2021-07-10 NOTE — Telephone Encounter (Signed)
Per CoverMyMeds:  PA was denied.   Please advise. 

## 2021-07-15 ENCOUNTER — Ambulatory Visit (AMBULATORY_SURGERY_CENTER): Payer: BC Managed Care – PPO

## 2021-07-15 ENCOUNTER — Other Ambulatory Visit: Payer: Self-pay | Admitting: Internal Medicine

## 2021-07-15 ENCOUNTER — Other Ambulatory Visit: Payer: Self-pay

## 2021-07-15 ENCOUNTER — Ambulatory Visit: Payer: BC Managed Care – PPO | Admitting: Obstetrics and Gynecology

## 2021-07-15 VITALS — Ht 65.0 in | Wt 259.0 lb

## 2021-07-15 DIAGNOSIS — E118 Type 2 diabetes mellitus with unspecified complications: Secondary | ICD-10-CM

## 2021-07-15 DIAGNOSIS — Z1211 Encounter for screening for malignant neoplasm of colon: Secondary | ICD-10-CM

## 2021-07-15 MED ORDER — PEG-KCL-NACL-NASULF-NA ASC-C 100 G PO SOLR: 1.0000 | Freq: Once | ORAL | 0 refills | Status: AC

## 2021-07-15 MED ORDER — TIRZEPATIDE 5 MG/0.5ML ~~LOC~~ SOAJ: 5.0000 mg | SUBCUTANEOUS | 0 refills | Status: DC

## 2021-07-15 NOTE — Telephone Encounter (Signed)
Per CoverMyMeds:  PA was denied.  4

## 2021-07-15 NOTE — Progress Notes (Signed)
Pre visit completed via phone call; patient verified name, DOB, and address; No egg or soy allergy known to patient  No issues known to pt with past sedation with any surgeries or procedures Patient denies ever being told they had issues or difficulty with intubation  No FH of Malignant Hyperthermia Pt is not on diet pills Pt is not on home 02  Pt is not on blood thinners  Pt reports issues with constipation -patient reports she takes Miralax and Benefiber No A fib or A flutter Pt is fully vaccinated for Covid x 2; NO PA's for preps discussed with pt in PV today  Discussed with pt there will be an out-of-pocket cost for prep and that varies from $0 to 70 +  dollars - pt verbalized understanding  Due to the COVID-19 pandemic we are asking patients to follow certain guidelines in PV and the [place]   Pt aware of COVID protocols and [facility] guidelines

## 2021-07-23 ENCOUNTER — Ambulatory Visit: Payer: BC Managed Care – PPO | Admitting: Obstetrics and Gynecology

## 2021-07-23 ENCOUNTER — Encounter: Payer: Self-pay | Admitting: Gastroenterology

## 2021-08-05 ENCOUNTER — Telehealth: Payer: Self-pay | Admitting: Gastroenterology

## 2021-08-05 NOTE — Telephone Encounter (Signed)
Hey Dr. Candis Schatz,   Patient called in to cancel procedure for 11/15 due to Suzanne Bartlett waking up with fever. She states she will call back to reschedule.   Thank you

## 2021-08-06 ENCOUNTER — Encounter: Payer: BC Managed Care – PPO | Admitting: Gastroenterology

## 2021-08-20 ENCOUNTER — Telehealth: Payer: Self-pay | Admitting: Internal Medicine

## 2021-08-22 ENCOUNTER — Encounter: Payer: Self-pay | Admitting: Internal Medicine

## 2021-08-22 DIAGNOSIS — E118 Type 2 diabetes mellitus with unspecified complications: Secondary | ICD-10-CM

## 2021-08-22 MED ORDER — TIRZEPATIDE 5 MG/0.5ML ~~LOC~~ SOAJ: 5.0000 mg | SUBCUTANEOUS | 0 refills | Status: DC

## 2021-08-27 ENCOUNTER — Encounter: Payer: Self-pay | Admitting: Internal Medicine

## 2021-08-28 ENCOUNTER — Other Ambulatory Visit: Payer: Self-pay | Admitting: Internal Medicine

## 2021-08-28 DIAGNOSIS — E118 Type 2 diabetes mellitus with unspecified complications: Secondary | ICD-10-CM

## 2021-08-28 MED ORDER — TIRZEPATIDE 10 MG/0.5ML ~~LOC~~ SOAJ: 10.0000 mg | SUBCUTANEOUS | 0 refills | Status: DC

## 2021-08-29 ENCOUNTER — Encounter: Payer: BC Managed Care – PPO | Admitting: Gastroenterology

## 2021-09-03 ENCOUNTER — Encounter: Payer: BC Managed Care – PPO | Admitting: Gastroenterology

## 2021-09-05 ENCOUNTER — Encounter: Payer: BC Managed Care – PPO | Admitting: Gastroenterology

## 2021-09-05 ENCOUNTER — Encounter: Payer: Self-pay | Admitting: Internal Medicine

## 2021-09-05 ENCOUNTER — Ambulatory Visit (AMBULATORY_SURGERY_CENTER): Payer: BC Managed Care – PPO | Admitting: Internal Medicine

## 2021-09-05 VITALS — BP 113/72 | HR 75 | Temp 98.4°F | Resp 20 | Ht 65.0 in | Wt 259.0 lb

## 2021-09-05 DIAGNOSIS — Z1211 Encounter for screening for malignant neoplasm of colon: Secondary | ICD-10-CM

## 2021-09-05 LAB — HM COLONOSCOPY

## 2021-09-05 MED ORDER — SODIUM CHLORIDE 0.9 % IV SOLN: 500.0000 mL | Freq: Once | INTRAVENOUS | Status: DC

## 2021-09-05 NOTE — Progress Notes (Signed)
PT taken to PACU. Monitors in place. VSS. Report given to RN. 

## 2021-09-05 NOTE — Patient Instructions (Signed)
YOU HAD AN ENDOSCOPIC PROCEDURE TODAY AT THE [facility]:   Refer to the procedure report that was given to you for any specific questions about what was found during the examination.  If the procedure report does not answer your questions, please call your gastroenterologist to clarify.  If you requested that your care partner not be given the details of your procedure findings, then the procedure report has been included in a sealed envelope for you to review at your convenience later. ° °YOU SHOULD EXPECT: Some feelings of bloating in the abdomen. Passage of more gas than usual.  Walking can help get rid of the air that was put into your GI tract during the procedure and reduce the bloating. If you had a lower endoscopy (such as a colonoscopy or flexible sigmoidoscopy) you may notice spotting of blood in your stool or on the toilet paper. If you underwent a bowel prep for your procedure, you may not have a normal bowel movement for a few days. ° °Please Note:  You might notice some irritation and congestion in your nose or some drainage.  This is from the oxygen used during your procedure.  There is no need for concern and it should clear up in a day or so. ° °SYMPTOMS TO REPORT IMMEDIATELY: ° °Following lower endoscopy (colonoscopy or flexible sigmoidoscopy): ° Excessive amounts of blood in the stool ° Significant tenderness or worsening of abdominal pains ° Swelling of the abdomen that is new, acute ° Fever of 100°F or higher ° °For urgent or emergent issues, a gastroenterologist can be reached at any hour by calling [phone]. °Do not use MyChart messaging for urgent concerns.  ° ° °DIET:  We do recommend a small meal at first, but then you may proceed to your regular diet.  Drink plenty of fluids but you should avoid alcoholic beverages for 24 hours. ° °ACTIVITY:  You should plan to take it easy for the rest of today and you should NOT DRIVE or use heavy machinery until tomorrow (because of  the sedation medicines used during the test).   ° °FOLLOW UP: °Our staff will call the number listed on your records 48-72 hours following your procedure to check on you and address any questions or concerns that you may have regarding the information given to you following your procedure. If we do not reach you, we will leave a message.  We will attempt to reach you two times.  During this call, we will ask if you have developed any symptoms of COVID 19. If you develop any symptoms (ie: fever, flu-like symptoms, shortness of breath, cough etc.) before then, please call [phone].  If you test positive for Covid 19 in the 2 weeks post procedure, please call and report this information to us.   ° °SIGNATURES/CONFIDENTIALITY: °You and/or your care partner have signed paperwork which will be entered into your electronic medical record.  These signatures attest to the fact that that the information above on your After Visit Summary has been reviewed and is understood.  Full responsibility of the confidentiality of this discharge information lies with you and/or your care-partner.  °

## 2021-09-05 NOTE — Progress Notes (Signed)
GASTROENTEROLOGY PROCEDURE H&P NOTE   Primary Care Physician: Janith Lima, MD    Reason for Procedure:   Colon cancer screening  Plan:    Colonoscopy  Patient is appropriate for endoscopic procedure(s) in the ambulatory ([place]) setting.  The nature of the procedure, as well as the risks, benefits, and alternatives were carefully and thoroughly reviewed with the patient. Ample time for discussion and questions allowed. The patient understood, was satisfied, and agreed to proceed.     HPI: Suzanne Bartlett is a 50 y.o. female who presents for colonoscopy for colon cancer screening  Past Medical History:  Diagnosis Date   Anemia    dx 02/2021- no meds at this time (07/15/2021)   Cancer ([place]) 2019   Basal cell left ear   Cervical stenosis (uterine cervix) 03/26/2017   Diabetes mellitus without complication ([place])    on meds   Elevated hemoglobin A1c 03/10/2017   level 7.1   Hyperlipidemia    on meds   Hypertension    on meds    Past Surgical History:  Procedure Laterality Date   APPENDECTOMY  1984   COLONOSCOPY     EXTERNAL EAR SURGERY Left 2019   basal cell removal   TUBAL LIGATION  1997   WISDOM TOOTH EXTRACTION      Prior to Admission medications   Medication Sig Start Date End Date Taking? Authorizing Provider  ibuprofen (ADVIL) 200 MG tablet Take 200 mg by mouth every 6 (six) hours as needed.   Yes [provider]  lisinopril-hydrochlorothiazide (ZESTORETIC) 20-12.5 MG tablet Take 2 tablets by mouth daily. 07/02/21  Yes Janith Lima, MD  metFORMIN (GLUCOPHAGE-XR) 500 MG 24 hr tablet Take 1 tablet (500 mg total) by mouth every morning. 07/01/21  Yes Janith Lima, MD  norethindrone (HEATHER) 0.35 MG tablet TAKE 1 TABLET AS DIRECTED BY PRESCRIBER OR PACKAGE INSTRUCTIONS 06/13/21  Yes Nunzio Cobbs, MD  OneTouch Delica Lancets 94R MISC See admin instructions. 10/20/17  Yes [provider]  ONETOUCH VERIO test strip USE 1  STRIP TO [place] DAILY 02/09/18  Yes [provider]  potassium chloride SA (KLOR-CON M15) 15 MEQ tablet Take 1 tablet (15 mEq total) by mouth 2 (two) times daily. 07/02/21  Yes Janith Lima, MD  rosuvastatin (CRESTOR) 5 MG tablet Take 1 tablet (5 mg total) by mouth daily. 04/09/21  Yes Janith Lima, MD  tirzepatide [facility]) 10 MG/0.5ML Pen Inject 10 mg into the skin once a week. 08/28/21   Janith Lima, MD  Continuous Blood Gluc Receiver (FREESTYLE LIBRE 2 READER) DEVI 1 Act by Does not apply route daily. Patient not taking: Reported on 09/05/2021 05/03/21   Janith Lima, MD  Continuous Blood Gluc Sensor (FREESTYLE LIBRE 2 SENSOR) MISC 1 Act by Does not apply route daily. Patient not taking: Reported on 09/05/2021 05/03/21   Janith Lima, MD  levocetirizine (XYZAL) 5 MG tablet Take 1 tablet (5 mg total) by mouth every evening. 06/19/21   Janith Lima, MD    Current Outpatient Medications  Medication Sig Dispense Refill   ibuprofen (ADVIL) 200 MG tablet Take 200 mg by mouth every 6 (six) hours as needed.     lisinopril-hydrochlorothiazide (ZESTORETIC) 20-12.5 MG tablet Take 2 tablets by mouth daily. 180 tablet 1   metFORMIN (GLUCOPHAGE-XR) 500 MG 24 hr tablet Take 1 tablet (500 mg total) by mouth every morning. 90 tablet 1   norethindrone (HEATHER) 0.35  MG tablet TAKE 1 TABLET AS DIRECTED BY PRESCRIBER OR PACKAGE INSTRUCTIONS 84 tablet 0   OneTouch Delica Lancets 70W MISC See admin instructions.     ONETOUCH VERIO test strip USE 1 STRIP TO CHECK GLUCOSE ONCE DAILY  11   potassium chloride SA (KLOR-CON M15) 15 MEQ tablet Take 1 tablet (15 mEq total) by mouth 2 (two) times daily. 180 tablet 0   rosuvastatin (CRESTOR) 5 MG tablet Take 1 tablet (5 mg total) by mouth daily. 90 tablet 1   tirzepatide (MOUNJARO) 10 MG/0.5ML Pen Inject 10 mg into the skin once a week. 6 mL 0   Continuous Blood Gluc Receiver (FREESTYLE LIBRE 2 READER) DEVI 1 Act by Does not apply route  daily. (Patient not taking: Reported on 09/05/2021) 2 each 5   Continuous Blood Gluc Sensor (FREESTYLE LIBRE 2 SENSOR) MISC 1 Act by Does not apply route daily. (Patient not taking: Reported on 09/05/2021) 2 each 5   levocetirizine (XYZAL) 5 MG tablet Take 1 tablet (5 mg total) by mouth every evening. 90 tablet 1   Current Facility-Administered Medications  Medication Dose Route Frequency Provider Last Rate Last Admin   0.9 %  sodium chloride infusion  500 mL Intravenous Once Sharyn Creamer, MD        Allergies as of 09/05/2021   (No Known Allergies)    Family History  Problem Relation Age of Onset   Diabetes Mother    Glaucoma Father    Breast cancer Maternal Grandmother        Late 70's   Diabetes Other        mother's side   Colon polyps Neg Hx    Colon cancer Neg Hx    Esophageal cancer Neg Hx    Stomach cancer Neg Hx    Rectal cancer Neg Hx     Social History   Socioeconomic History   Marital status: Married    Spouse name: Not on file   Number of children: Not on file   Years of education: Not on file   Highest education level: Not on file  Occupational History   Not on file  Tobacco Use   Smoking status: Former    Packs/day: 0.50    Years: 16.00    Pack years: 8.00    Types: Cigarettes    Quit date: 2014    Years since quitting: 8.9   Smokeless tobacco: Never  Vaping Use   Vaping Use: Never used  Substance and Sexual Activity   Alcohol use: Not Currently    Alcohol/week: 0.0 standard drinks    Comment: rarely   Drug use: No   Sexual activity: Yes    Partners: Male    Birth control/protection: Surgical    Comment: Tubal Ligation  Other Topics Concern   Not on file  Social History Narrative   Not on file   Social Determinants of Health   Financial Resource Strain: Not on file  Food Insecurity: Not on file  Transportation Needs: Not on file  Physical Activity: Not on file  Stress: Not on file  Social Connections: Not on file  Intimate Partner  Violence: Not on file    Physical Exam: Vital signs in last 24 hours: BP 124/72    Pulse 86    Temp 98.4 F (36.9 C) (Temporal)    Ht 5\' 5"  (1.651 m)    Wt 259 lb (117.5 kg)    LMP 01/20/2021 Comment: states takes oral contraceptives for the hormones  but not for contraception   SpO2 99%    BMI 43.10 kg/m  GEN: NAD EYE: Sclerae anicteric ENT: MMM CV: Non-tachycardic Pulm: No increased work of breathing GI: Soft, NT/ND NEURO:  Alert & Oriented   Christia Reading, MD [facility]  09/05/2021 12:23 PM

## 2021-09-05 NOTE — Op Note (Signed)
[place] Patient Name: Suzanne Bartlett Procedure Date: 09/05/2021 12:26 PM MRN: [id number] Endoscopist: Sonny Masters "Suzanne Bartlett ,  Age: 50 Referring MD:  Date of Birth: 05-02-1971 Gender: Female Account #: [id number] Procedure:                Colonoscopy Indications:              Screening for colorectal malignant neoplasm Medicines:                Monitored Anesthesia Care Procedure:                Pre-Anesthesia Assessment:                           - Prior to the procedure, a History and Physical                            was performed, and patient medications and                            allergies were reviewed. The patient's tolerance of                            previous anesthesia was also reviewed. The risks                            and benefits of the procedure and the sedation                            options and risks were discussed with the patient.                            All questions were answered, and informed consent                            was obtained. Prior Anticoagulants: The patient has                            taken no previous anticoagulant or antiplatelet                            agents. ASA Grade Assessment: III - A patient with                            severe systemic disease. After reviewing the risks                            and benefits, the patient was deemed in                            satisfactory condition to undergo the procedure.                           After obtaining informed consent, the colonoscope  was passed under direct vision. Throughout the                            procedure, the patient's blood pressure, pulse, and                            oxygen saturations were monitored continuously. The                            Olympus CF-HQ190L [id number]) Colonoscope was                            introduced through the anus and advanced to the the                            cecum,  identified by appendiceal orifice and                            ileocecal valve. The colonoscopy was performed                            without difficulty. The patient tolerated the                            procedure well. The quality of the bowel                            preparation was adequate. The terminal ileum,                            ileocecal valve, appendiceal orifice, and rectum                            were photographed. Scope In: 12:31:54 PM Scope Out: 1:03:26 PM Scope Withdrawal Time: 0 hours 25 minutes 35 seconds  Total Procedure Duration: 0 hours 31 minutes 32 seconds  Findings:                 Non-bleeding internal hemorrhoids were found during                            retroflexion.                           The exam was otherwise without abnormality. Complications:            No immediate complications. Estimated Blood Loss:     Estimated blood loss was minimal. Impression:               - The examined portion of the ileum was normal.                           - Non-bleeding internal hemorrhoids.                           - The examination was otherwise normal.                           -  No specimens collected. Recommendation:           - Discharge patient to home (with escort).                           - Repeat colonoscopy in 10 years for screening                            purposes.                           - The findings and recommendations were discussed                            with the patient. Sonny Masters "Suzanne Bartlett,  09/05/2021 1:07:01 PM

## 2021-09-06 ENCOUNTER — Other Ambulatory Visit: Payer: Self-pay | Admitting: Obstetrics and Gynecology

## 2021-09-06 NOTE — Telephone Encounter (Signed)
Annual exam scheduled on 10/16/21 Last annual exam was 07/09/20 Last mammogram 11/2020

## 2021-09-09 ENCOUNTER — Telehealth: Payer: Self-pay

## 2021-09-09 NOTE — Telephone Encounter (Signed)
°  Follow up Call-  Call back number 09/05/2021  Post procedure Call Back phone  # [phone]  Permission to leave phone message Yes  Some recent data might be hidden     Patient questions:  Do you have a fever, pain , or abdominal swelling? No. Pain Score  0 *  Have you tolerated food without any problems? Yes.    Have you been able to return to your normal activities? Yes.    Do you have any questions about your discharge instructions: Diet   No. Medications  No. Follow up visit  No.  Do you have questions or concerns about your Care? No.  Actions: * If pain score is 4 or above: No action needed, pain <4.

## 2021-10-15 ENCOUNTER — Telehealth: Payer: Self-pay

## 2021-10-15 ENCOUNTER — Other Ambulatory Visit: Payer: Self-pay | Admitting: Internal Medicine

## 2021-10-15 DIAGNOSIS — E118 Type 2 diabetes mellitus with unspecified complications: Secondary | ICD-10-CM

## 2021-10-15 MED ORDER — TIRZEPATIDE 10 MG/0.5ML ~~LOC~~ SOAJ: 10.0000 mg | SUBCUTANEOUS | 0 refills | Status: DC

## 2021-10-15 NOTE — Progress Notes (Signed)
51 y.o. G2P2 Married Caucasian female here for annual exam.    Taking medication for weight loss, Mounjaro.  Her blood sugar went low and she passed out.  She has bruising on her face and she is having dental care now.  She will see her physician prescribing her [facility] tomorrow.   A1C 6.6 on 07/01/21.   She has lost about 35 pounds.   No menses since May, 2022. No pain or discomfort.  No hot flashes, but she get hot at night.   Has a second grandchild.   PCP:  Scarlette Calico, MD   Patient's last menstrual period was 02/19/2021 (exact date).     Period Pattern: (!) Irregular     Sexually active: Yes.    The current method of family planning is tubal ligation/Micronor    Exercising: No.  The patient does not participate in regular exercise at present. Smoker:  Former  Health Maintenance: Pap:  03-22-18 Neg:Neg HR HPV, 12-01-14 Neg:Neg HR HPV History of abnormal Pap:  Yes, after childbirth years ago per patient. No treatment MMG:  12-03-20 Lt.Br.asymmetry;Rt.Br.neg. Lt.Br.diag.Neg/Screening in 1 year. Colonoscopy:  09-05-21 normal;10 years BMD:   n/a  Result  n/a TDaP:  12-01-14 Gardasil:   no HIV: 09-11-14 NR Hep C:10-10-22NR Screening Labs:  PCP.    reports that she quit smoking about 9 years ago. Her smoking use included cigarettes. She has a 8.00 pack-year smoking history. She has never used smokeless tobacco. She reports that she does not currently use alcohol. She reports that she does not use drugs.  Past Medical History:  Diagnosis Date   Anemia    dx 02/2021- no meds at this time (07/15/2021)   Cancer ([place]) 2019   Basal cell left ear   Cervical stenosis (uterine cervix) 03/26/2017   Diabetes mellitus without complication ([place])    on meds   Elevated hemoglobin A1c 03/10/2017   level 7.1   Hyperlipidemia    on meds   Hypertension    on meds    Past Surgical History:  Procedure Laterality Date   APPENDECTOMY  1984   COLONOSCOPY     EXTERNAL EAR SURGERY Left  2019   basal cell removal   TUBAL LIGATION  1997   WISDOM TOOTH EXTRACTION      Current Outpatient Medications  Medication Sig Dispense Refill   Continuous Blood Gluc Receiver (FREESTYLE LIBRE 2 READER) DEVI 1 Act by Does not apply route daily. (Patient not taking: Reported on 09/05/2021) 2 each 5   Continuous Blood Gluc Sensor (FREESTYLE LIBRE 2 SENSOR) MISC 1 Act by Does not apply route daily. (Patient not taking: Reported on 09/05/2021) 2 each 5   ibuprofen (ADVIL) 200 MG tablet Take 200 mg by mouth every 6 (six) hours as needed.     levocetirizine (XYZAL) 5 MG tablet Take 1 tablet (5 mg total) by mouth every evening. 90 tablet 1   lisinopril-hydrochlorothiazide (ZESTORETIC) 20-12.5 MG tablet Take 2 tablets by mouth daily. 180 tablet 1   metFORMIN (GLUCOPHAGE-XR) 500 MG 24 hr tablet Take 1 tablet (500 mg total) by mouth every morning. 90 tablet 1   norethindrone (HEATHER) 0.35 MG tablet TAKE 1 TABLET AS DIRECTED BY PRESCRIBER OR PACKAGE INSTRUCTIONS 84 tablet 0   OneTouch Delica Lancets 57D MISC See admin instructions.     ONETOUCH VERIO test strip USE 1 STRIP TO CHECK GLUCOSE ONCE DAILY  11   potassium chloride SA (KLOR-CON M15) 15 MEQ tablet Take 1 tablet (15 mEq total)  by mouth 2 (two) times daily. 180 tablet 0   rosuvastatin (CRESTOR) 5 MG tablet Take 1 tablet (5 mg total) by mouth daily. 90 tablet 1   tirzepatide (MOUNJARO) 10 MG/0.5ML Pen Inject 10 mg into the skin once a week. 6 mL 0   No current facility-administered medications for this visit.    Family History  Problem Relation Age of Onset   Diabetes Mother    Glaucoma Father    Breast cancer Maternal Grandmother        Late 70's   Diabetes Other        mother's side   Colon polyps Neg Hx    Colon cancer Neg Hx    Esophageal cancer Neg Hx    Stomach cancer Neg Hx    Rectal cancer Neg Hx     Review of Systems  All other systems reviewed and are negative.  Exam:   BP 118/78    Pulse 87    Ht 5\' 5"  (1.651 m)     Wt 245 lb (111.1 kg)    LMP 02/19/2021 (Exact Date)    SpO2 99%    BMI 40.77 kg/m     General appearance: alert, cooperative and appears stated age Head: normocephalic, without obvious abnormality, atraumatic Neck: no adenopathy, supple, symmetrical, trachea midline and thyroid normal to inspection and palpation Lungs: clear to auscultation bilaterally Breasts: normal appearance, no masses or tenderness, No nipple retraction or dimpling, No nipple discharge or bleeding, No axillary adenopathy Heart: regular rate and rhythm Abdomen: soft, non-tender; no masses, no organomegaly Extremities: extremities normal, atraumatic, no cyanosis or edema Skin: skin color, texture, turgor normal. No rashes or lesions Lymph nodes: cervical, supraclavicular, and axillary nodes normal. Neurologic: grossly normal  Pelvic: External genitalia:  no lesions              No abnormal inguinal nodes palpated.              Urethra:  normal appearing urethra with no masses, tenderness or lesions              Bartholins and Skenes: normal                 Vagina: normal appearing vagina with normal color and discharge, no lesions              Cervix: no lesions              Pap taken: no Bimanual Exam:  Uterus:  normal size, contour, position, consistency, mobility, non-tender              Adnexa: no mass, fullness, tenderness              Rectal exam: yes.  Confirms.              Anus:  normal sphincter tone, no lesions  Chaperone was present for exam:  Estill Bamberg, CMA  Assessment:   Well woman visit with gynecologic exam. On Micronor to control menorrhagia and dysmenorrhea.  Status post BTL.  Hx cervical stenosis.   Plan: Mammogram screening discussed. Self breast awareness reviewed. Pap and HR HPV as above. Guidelines for Calcium, Vitamin D, regular exercise program including cardiovascular and weight bearing exercise. Refill of Micronor for one year.   If she does not have a menstruation for the next year,  she will stop her Micronor for 2 weeks prior to her next annual exam and we will check her [place] and estradiol. Labs with PCP  Follow up annually and prn.   After visit summary provided.

## 2021-10-15 NOTE — Telephone Encounter (Signed)
Pt called in to request a refill on: tirzepatide (MOUNJARO) 10 MG/0.5ML Pen.  Pharmacy: [facility] [address], [place] - [address]  LOV 07/01/21  Pt scheduled F/U today for 4/11.  Pt CB [phone]

## 2021-10-16 ENCOUNTER — Encounter: Payer: Self-pay | Admitting: Obstetrics and Gynecology

## 2021-10-16 ENCOUNTER — Other Ambulatory Visit: Payer: Self-pay

## 2021-10-16 ENCOUNTER — Ambulatory Visit (INDEPENDENT_AMBULATORY_CARE_PROVIDER_SITE_OTHER): Payer: BC Managed Care – PPO | Admitting: Obstetrics and Gynecology

## 2021-10-16 ENCOUNTER — Telehealth: Payer: Self-pay

## 2021-10-16 VITALS — BP 118/78 | HR 87 | Ht 65.0 in | Wt 245.0 lb

## 2021-10-16 DIAGNOSIS — Z01419 Encounter for gynecological examination (general) (routine) without abnormal findings: Secondary | ICD-10-CM | POA: Diagnosis not present

## 2021-10-16 MED ORDER — NORETHINDRONE 0.35 MG PO TABS
ORAL_TABLET | ORAL | 3 refills | Status: DC
Start: 2021-10-16 — End: 2022-10-27

## 2021-10-16 NOTE — Patient Instructions (Signed)

## 2021-10-16 NOTE — Telephone Encounter (Signed)
Key: Willene Hatchet

## 2021-10-17 ENCOUNTER — Encounter: Payer: Self-pay | Admitting: Nurse Practitioner

## 2021-10-17 ENCOUNTER — Ambulatory Visit: Payer: BC Managed Care – PPO | Admitting: Nurse Practitioner

## 2021-10-17 VITALS — BP 130/84 | HR 92 | Temp 98.6°F | Ht 65.0 in | Wt 249.0 lb

## 2021-10-17 DIAGNOSIS — R55 Syncope and collapse: Secondary | ICD-10-CM

## 2021-10-17 LAB — COMPREHENSIVE METABOLIC PANEL
ALT: 14 U/L (ref 0–35)
AST: 15 U/L (ref 0–37)
Albumin: 4.6 g/dL (ref 3.5–5.2)
Alkaline Phosphatase: 62 U/L (ref 39–117)
BUN: 12 mg/dL (ref 6–23)
CO2: 31 mEq/L (ref 19–32)
Calcium: 10 mg/dL (ref 8.4–10.5)
Chloride: 102 mEq/L (ref 96–112)
Creatinine, Ser: 0.81 mg/dL (ref 0.40–1.20)
GFR: 83.4 mL/min (ref >60.00)
Glucose, Bld: 100 mg/dL — ABNORMAL HIGH (ref 70–99)
Potassium: 3.7 mEq/L (ref 3.5–5.1)
Sodium: 141 mEq/L (ref 135–145)
Total Bilirubin: 0.5 mg/dL (ref 0.2–1.2)
Total Protein: 7.4 g/dL (ref 6.0–8.3)

## 2021-10-17 LAB — TSH: TSH: 1.72 u[IU]/mL (ref 0.35–5.50)

## 2021-10-17 LAB — CBC
HCT: 38 % (ref 36.0–46.0)
Hemoglobin: 12.9 g/dL (ref 12.0–15.0)
MCHC: 34 g/dL (ref 30.0–36.0)
MCV: 86.9 fl (ref 78.0–100.0)
Platelets: 227 10*3/uL (ref 150.0–400.0)
RBC: 4.38 Mil/uL (ref 3.87–5.11)
RDW: 13.4 % (ref 11.5–15.5)
WBC: 7.3 10*3/uL (ref 4.0–10.5)

## 2021-10-17 LAB — T3, FREE: T3, Free: 3.3 pg/mL (ref 2.3–4.2)

## 2021-10-17 LAB — T4, FREE: Free T4: 0.87 ng/dL (ref 0.60–1.60)

## 2021-10-17 NOTE — Progress Notes (Signed)
Subjective:  Patient ID: Suzanne Bartlett, female    DOB: Sep 19, 1971  Age: 51 y.o. MRN: [id number]  CC:  Chief Complaint  Patient presents with   Loss of Consciousness      HPI  This patient arrives today for the above.  About 1 week ago she had a syncopal episode.  She has never had something like this happen before.  She does have a history of diabetes and reports that prior to her syncopal episode her freestyle Elenor Legato was alarming and telling her she was having low blood sugars, however she is not having any symptoms of hypoglycemia.  She tells me she has had hypoglycemia in the past and would have irritability and shakes when this would occur.  She was not having these symptoms at the time that her Elenor Legato was alarming.  She ended up changing her Elenor Legato because she thought these may have been false alarms and since then has not had any recurrent low blood sugars.  At the time of the syncopal episode she was sleeping but was woken by her dog who needed to use the restroom.  So she got out of bed and put her dog outside.  She was at the dining room table waiting for her dog to be ready to come back in when she all of a sudden felt a "weird sensation" that went up and down her body and some dizziness.  She thought she may throw up and wondered if she had to "use the bathroom".  She tells me she stood up and went to walk to the pantry at which point she lost consciousness.  She fell and hit the left side of her face against the edge of the couch.  Her husband was home at the time and he heard the fall.  She tells me she regained consciousness with him trying to awaken her.  She reports that she was slightly confused when she first woke up and was not sure where she was but quickly reoriented herself.  She is not sure how long she was unconscious for but feels it was only a few moments.  She denies any urinary or fecal incontinence during the episode.  She denies any chest pain or shortness of  breath during the episode.  She denies having any recent palpitations.  She tells me that her husband did not report any twitching or other seizure-like activity however he did notice the left side of her face seemed to have a muscle spasm as her mouth was in a sort of grimace like expression.  She did not have a glucometer readily available at the time of the episode to check her blood sugar but reports that her [place] freestyle record her blood sugar is 122 at the time that she passed out.  Last A1c was 6.6 and this was collected in October 2022.   She denies any personal or family history of heart disease or arrhythmia. She denies family history of seizures. She does have a daughter who will pass out when she physically hits certain aspects of her body.   Past Medical History:  Diagnosis Date   Anemia    dx 02/2021- no meds at this time (07/15/2021)   Cancer ([place]) 2019   Basal cell left ear   Cervical stenosis (uterine cervix) 03/26/2017   Diabetes mellitus without complication ([place])    on meds   Elevated hemoglobin A1c 03/10/2017   level 7.1   Hyperlipidemia  on meds   Hypertension    on meds      Family History  Problem Relation Age of Onset   Diabetes Mother    Glaucoma Father    Breast cancer Maternal Grandmother        Late 70's   Diabetes Other        mother's side   Colon polyps Neg Hx    Colon cancer Neg Hx    Esophageal cancer Neg Hx    Stomach cancer Neg Hx    Rectal cancer Neg Hx     Social History   Social History Narrative   Not on file   Social History   Tobacco Use   Smoking status: Former    Packs/day: 0.50    Years: 16.00    Pack years: 8.00    Types: Cigarettes    Quit date: 2014    Years since quitting: 9.0   Smokeless tobacco: Never  Substance Use Topics   Alcohol use: Not Currently    Alcohol/week: 0.0 standard drinks    Comment: rarely     Current Meds  Medication Sig   Continuous Blood Gluc Receiver (FREESTYLE LIBRE 2 READER)  DEVI 1 Act by Does not apply route daily.   Continuous Blood Gluc Sensor (FREESTYLE LIBRE 2 SENSOR) MISC 1 Act by Does not apply route daily.   ibuprofen (ADVIL) 200 MG tablet Take 200 mg by mouth every 6 (six) hours as needed.   levocetirizine (XYZAL) 5 MG tablet Take 1 tablet (5 mg total) by mouth every evening.   lisinopril-hydrochlorothiazide (ZESTORETIC) 20-12.5 MG tablet Take 2 tablets by mouth daily.   metFORMIN (GLUCOPHAGE-XR) 500 MG 24 hr tablet Take 1 tablet (500 mg total) by mouth every morning.   norethindrone (HEATHER) 0.35 MG tablet TAKE 1 TABLET BY MOUTH DAILY.   OneTouch Delica Lancets 34J MISC See admin instructions.   ONETOUCH VERIO test strip USE 1 STRIP TO CHECK GLUCOSE ONCE DAILY   potassium chloride SA (KLOR-CON M15) 15 MEQ tablet Take 1 tablet (15 mEq total) by mouth 2 (two) times daily.   rosuvastatin (CRESTOR) 5 MG tablet Take 1 tablet (5 mg total) by mouth daily.   tirzepatide (MOUNJARO) 10 MG/0.5ML Pen Inject 10 mg into the skin once a week.    ROS:  Review of Systems  Constitutional:  Negative for fever.  Respiratory:  Negative for shortness of breath.   Cardiovascular:  Negative for chest pain and palpitations.  Neurological:  Positive for loss of consciousness. Negative for dizziness and seizures.    Objective:   Today's Vitals: BP 130/84 (BP Location: Left Arm, Patient Position: Sitting, Cuff Size: Normal)    Pulse 92    Temp 98.6 F (37 C) (Oral)    Ht 5\' 5"  (1.651 m)    Wt 249 lb (112.9 kg)    SpO2 92%    BMI 41.44 kg/m  Vitals with BMI 10/17/2021 10/16/2021 09/05/2021  Height 5\' 5"  5\' 5"  -  Weight 249 lbs 245 lbs -  BMI 17.91 50.56 -  Systolic 979 480 165  Diastolic 84 78 72  Pulse 92 87 75     Physical Exam Vitals reviewed.  Constitutional:      General: She is not in acute distress.    Appearance: Normal appearance.  HENT:     Head: Normocephalic and atraumatic.  Neck:     Vascular: No carotid bruit.  Cardiovascular:     Rate and Rhythm:  Normal rate and regular  rhythm.     Pulses: Normal pulses.     Heart sounds: Normal heart sounds.  Pulmonary:     Effort: Pulmonary effort is normal.     Breath sounds: Normal breath sounds.  Skin:    General: Skin is warm and dry.  Neurological:     General: No focal deficit present.     Mental Status: She is alert and oriented to person, place, and time.     Cranial Nerves: Cranial nerves 2-12 are intact.     Sensory: Sensation is intact.     Motor: Motor function is intact.     Coordination: Coordination is intact.     Gait: Gait is intact.  Psychiatric:        Mood and Affect: Mood normal.        Behavior: Behavior normal.        Judgment: Judgment normal.      Orthostatic vital signs: Sitting: 127/85, 79 Standing: 132/86, 89   Assessment and Plan   1. Syncope, unspecified syncope type      Plan: 1.  Sounds like possibly a vasovagal syncopal episode.  Will refer to cardiology to rule out any other underlying pathology that could have led to the syncopal episode.  We will also refer to neurology to rule out seizure.  It does not sound like she is having hypoglycemia, however it is odd that her freestyle Elenor Legato was alarming low blood sugars the week prior to the syncopal episode.  We will check CBC to rule out anemia, thyroid panel to check her thyroid disease, and metabolic panel to check glucose today.  She was told to call the office if she experiences any recurrence in the symptoms or another syncopal episode.  She tells me she understands.   Tests ordered Orders Placed This Encounter  Procedures   Comprehensive metabolic panel   CBC   TSH   T3, free   T4, free   Ambulatory referral to Cardiology   Ambulatory referral to Neurology      No orders of the defined types were placed in this encounter.   Patient to follow-up with PCP in April as scheduled or sooner as needed. I spent 35 minutes dedicated to the care of this patient on the date of this encounter  which includes a combination of either face-to-face or virtual contact with the patient, review of  previous labs, and ordering of tests and/or procedures.   Ailene Ards, NP

## 2021-10-18 ENCOUNTER — Encounter: Payer: Self-pay | Admitting: Neurology

## 2021-10-18 ENCOUNTER — Other Ambulatory Visit: Payer: Self-pay

## 2021-10-18 ENCOUNTER — Ambulatory Visit: Payer: BC Managed Care – PPO | Admitting: Neurology

## 2021-10-18 VITALS — BP 121/68 | HR 78 | Ht 65.0 in | Wt 248.8 lb

## 2021-10-18 DIAGNOSIS — R55 Syncope and collapse: Secondary | ICD-10-CM

## 2021-10-18 NOTE — Progress Notes (Signed)
NEUROLOGY CONSULTATION NOTE  Suzanne Bartlett MRN: [id number] DOB: 1971-07-17  Referring provider: Jeralyn Ruths, NP Primary care provider: Dr. Scarlette Calico  Reason for consult:  syncope  Thank you for your kind referral of Suzanne Bartlett for consultation of the above symptoms. Although her history is well known to you, please allow me to reiterate it for the purpose of our medical record. The patient was accompanied to the clinic by her husband who also provides collateral information. Records and images were personally reviewed where available.   HISTORY OF PRESENT ILLNESS: This is a 51 year old right-handed woman with a history of hypertension, hyperlipidemia, DM, presenting for evaluation of an episode of loss of consciousness that occurred on 10/11/2021. She recalls getting out of bed at 1am to get the dogs, she let them out and went back in, and as she closed the door, she was standing and felt a "whoosh" of sensation from head to toe. She felt a tad bit dizzy, a little hot, wondering if she was going to be sick. The sensation eased off and she let the dogs back in, she stood up to open the door and went to get her husband then woke up to see she was 10 feet away from where she had been, she fell on furniture and hit the left side of her face. When her husband arrived, he noticed her face was drawing a little to the right, she was a little combative turning back and forth. He was shaking her and her eyes were closed, it took 30-60 seconds to be coherent and answer she was okay. She sat up and said she was jittery, got food and within 10 minutes got up with no focal weakness. No tongue bite or incontinence. He noticed she was extremely pale, no diaphoresis. Her glucose level was 101. They deny any staring episodes, no other gaps in time. There are times she smells cigarette smoke. No focal numbness/tingling/weakness, no myoclonic jerks. She denies any headaches, diplopia,  dysarthria/dysphagia, neck/back pain, bladder dysfunction. She has some constipation. She denies any palpitations. Sometimes she would wake up at 1am feeling hot, attributing it to a blood sugar drop. No daytime drowsiness. She has always had some memory issues, "sometimes a little bit distracted." Her daughter has syncopal episodes triggered by injury. She had a normal birth and early development.  There is no history of febrile convulsions, CNS infections such as meningitis/encephalitis, significant traumatic brain injury, neurosurgical procedures, or family history of seizures.   PAST MEDICAL HISTORY: Past Medical History:  Diagnosis Date   Anemia    dx 02/2021- no meds at this time (07/15/2021)   Cancer ([place]) 2019   Basal cell left ear   Cervical stenosis (uterine cervix) 03/26/2017   Diabetes mellitus without complication ([place])    on meds   Elevated hemoglobin A1c 03/10/2017   level 7.1   Hyperlipidemia    on meds   Hypertension    on meds    PAST SURGICAL HISTORY: Past Surgical History:  Procedure Laterality Date   APPENDECTOMY  1984   COLONOSCOPY     EXTERNAL EAR SURGERY Left 2019   basal cell removal   TUBAL LIGATION  1997   WISDOM TOOTH EXTRACTION      MEDICATIONS: Current Outpatient Medications on File Prior to Visit  Medication Sig Dispense Refill   Continuous Blood Gluc Receiver (FREESTYLE LIBRE 2 READER) DEVI 1 Act by Does not apply route daily. 2 each 5   Continuous  Blood Gluc Sensor (FREESTYLE LIBRE 2 SENSOR) MISC 1 Act by Does not apply route daily. 2 each 5   ibuprofen (ADVIL) 200 MG tablet Take 200 mg by mouth every 6 (six) hours as needed.     levocetirizine (XYZAL) 5 MG tablet Take 1 tablet (5 mg total) by mouth every evening. 90 tablet 1   lisinopril-hydrochlorothiazide (ZESTORETIC) 20-12.5 MG tablet Take 2 tablets by mouth daily. 180 tablet 1   metFORMIN (GLUCOPHAGE-XR) 500 MG 24 hr tablet Take 1 tablet (500 mg total) by mouth every morning. 90 tablet 1    norethindrone (HEATHER) 0.35 MG tablet TAKE 1 TABLET BY MOUTH DAILY. 84 tablet 3   OneTouch Delica Lancets 24M MISC See admin instructions.     ONETOUCH VERIO test strip USE 1 STRIP TO CHECK GLUCOSE ONCE DAILY  11   potassium chloride SA (KLOR-CON M15) 15 MEQ tablet Take 1 tablet (15 mEq total) by mouth 2 (two) times daily. 180 tablet 0   rosuvastatin (CRESTOR) 5 MG tablet Take 1 tablet (5 mg total) by mouth daily. 90 tablet 1   tirzepatide (MOUNJARO) 10 MG/0.5ML Pen Inject 10 mg into the skin once a week. 6 mL 0   No current facility-administered medications on file prior to visit.    ALLERGIES: No Known Allergies  FAMILY HISTORY: Family History  Problem Relation Age of Onset   Diabetes Mother    Glaucoma Father    Breast cancer Maternal Grandmother        Late 70's   Diabetes Other        mother's side   Colon polyps Neg Hx    Colon cancer Neg Hx    Esophageal cancer Neg Hx    Stomach cancer Neg Hx    Rectal cancer Neg Hx     SOCIAL HISTORY: Social History   Socioeconomic History   Marital status: Married    Spouse name: Not on file   Number of children: Not on file   Years of education: Not on file   Highest education level: Not on file  Occupational History   Not on file  Tobacco Use   Smoking status: Former    Packs/day: 0.50    Years: 16.00    Pack years: 8.00    Types: Cigarettes    Quit date: 2014    Years since quitting: 9.0   Smokeless tobacco: Never  Vaping Use   Vaping Use: Never used  Substance and Sexual Activity   Alcohol use: Not Currently    Alcohol/week: 0.0 standard drinks    Comment: rarely   Drug use: No   Sexual activity: Yes    Partners: Male    Birth control/protection: Surgical    Comment: Tubal Ligation  Other Topics Concern   Not on file  Social History Narrative   Not on file   Social Determinants of Health   Financial Resource Strain: Not on file  Food Insecurity: Not on file  Transportation Needs: Not on file   Physical Activity: Not on file  Stress: Not on file  Social Connections: Not on file  Intimate Partner Violence: Not on file     PHYSICAL EXAM: Vitals:   10/18/21 1257  BP: 121/68  Pulse: 78  SpO2: 99%   General: No acute distress Head:  Normocephalic/atraumatic Skin/Extremities: No rash, no edema Neurological Exam: Mental status: alert and oriented to person, place, and time, no dysarthria or aphasia, Fund of knowledge is appropriate.  Recent and remote memory are intact,  3/3 delayed recall. Attention and concentration are normal, 5/5 WORLD backwards.  Cranial nerves: CN I: not tested CN II: pupils equal, round and reactive to light, visual fields intact CN III, IV, VI:  full range of motion, no nystagmus, no ptosis CN V: facial sensation intact CN VII: upper and lower face symmetric CN VIII: hearing intact to conversation Bulk & Tone: normal, no fasciculations. Motor: 5/5 throughout with no pronator drift. Sensation: intact to light touch, cold, pin, vibration sense.  No extinction to double simultaneous stimulation.  Romberg test negative Deep Tendon Reflexes: +1 both UE, +2 both LE Cerebellar: no incoordination on finger to nose testing Gait: narrow-based and steady, able to tandem walk adequately. Tremor: none   IMPRESSION: This is a 51 year old right-handed woman with a history of hypertension, hyperlipidemia, DM, presenting for evaluation of an episode of loss of consciousness that occurred on 10/11/2021. Her neurological exam is normal, no clear epilepsy risk factors. Etiology of episode unclear, suggestive of vasovagal syncope, less likely seizure.  We will do an EEG for completion. Office will call with results, if normal, follow-up as needed.    Thank you for allowing me to participate in the care of this patient. Please do not hesitate to call for any questions or concerns.   Ellouise Newer, M.D.  CC: Dr. Ronnald Ramp Jeralyn Ruths, NP

## 2021-10-18 NOTE — Telephone Encounter (Signed)
Per CoverMyMeds: ? ?PA was denied.  ?

## 2021-10-18 NOTE — Patient Instructions (Signed)
Good to meet you. Schedule EEG. Our office will call with results, if normal, follow-up as needed. Call for any changes.

## 2021-10-20 ENCOUNTER — Other Ambulatory Visit: Payer: Self-pay | Admitting: Internal Medicine

## 2021-10-20 DIAGNOSIS — I10 Essential (primary) hypertension: Secondary | ICD-10-CM

## 2021-10-20 DIAGNOSIS — E876 Hypokalemia: Secondary | ICD-10-CM

## 2021-10-20 DIAGNOSIS — T502X5A Adverse effect of carbonic-anhydrase inhibitors, benzothiadiazides and other diuretics, initial encounter: Secondary | ICD-10-CM

## 2021-10-24 ENCOUNTER — Other Ambulatory Visit: Payer: Self-pay | Admitting: Internal Medicine

## 2021-10-24 NOTE — Telephone Encounter (Signed)
Attempted to call pt. She has not room to leave additional VM's.  Will attempt again later.

## 2021-10-25 ENCOUNTER — Other Ambulatory Visit: Payer: Self-pay | Admitting: Internal Medicine

## 2021-10-25 DIAGNOSIS — E118 Type 2 diabetes mellitus with unspecified complications: Secondary | ICD-10-CM

## 2021-10-25 MED ORDER — TRULICITY 0.75 MG/0.5ML ~~LOC~~ SOAJ: 0.7500 mg | SUBCUTANEOUS | 0 refills | Status: DC

## 2021-10-25 NOTE — Telephone Encounter (Signed)
Pt stated that she would speak with her pharmacy in regard to a coupon program that she was previously on with [facility] before she makes a decision to switch to [employer]. She will call the office back with an update.

## 2021-10-28 ENCOUNTER — Encounter: Payer: Self-pay | Admitting: Internal Medicine

## 2021-10-28 ENCOUNTER — Other Ambulatory Visit: Payer: Self-pay | Admitting: Internal Medicine

## 2021-10-28 DIAGNOSIS — E118 Type 2 diabetes mellitus with unspecified complications: Secondary | ICD-10-CM

## 2021-10-28 MED ORDER — TIRZEPATIDE 7.5 MG/0.5ML ~~LOC~~ SOAJ: 7.5000 mg | SUBCUTANEOUS | 0 refills | Status: DC

## 2021-11-07 NOTE — Progress Notes (Signed)
Cardiology Office Note:    Date:  11/08/2021   ID:  Suzanne Bartlett, DOB 1971-04-17, MRN [id number]  PCP:  Janith Lima, MD   [facility] Providers Cardiologist:  Lenna Sciara, MD Referring MD: Ailene Ards, NP   Chief Complaint/Reason for Referral: Syncope  ASSESSMENT:    Syncope and collapse  Type II diabetes mellitus with manifestations ([place])  Primary hypertension  Hyperlipidemia, unspecified hyperlipidemia type  BMI 40.0-44.9, adult ([place])    PLAN:    In order of problems listed above:  1.  This is likely due to vasovagal syncope.  We will obtain an echocardiogram and 3-day monitor to evaluate further.  Follow up 1 year. 2.  Given her diabetes we will start aspirin 81 mg daily, continue Crestor, and lisinopril.  Start [place]. 3.  Continue lisinopril. 4.  Her LDL goal is less than 70 given her history of diabetes.  This is being followed by her primary care provider.  Last lipid panel was at goal. 5.  Being managed by PCP, patient has lost 40 lbs.  On  Mounjaro.    Dispo:  Return in about 1 year (around 11/08/2022).     Medication Adjustments/Labs and Tests Ordered: Current medicines are reviewed at length with the patient today.  Concerns regarding medicines are outlined above.   Tests Ordered: No orders of the defined types were placed in this encounter.   Medication Changes: No orders of the defined types were placed in this encounter.   History of Present Illness:    FOCUSED CARDIOVASCULAR PROBLEM LIST:   1.  Type 2 diabetes 2.  Hypertension 3.  Hyperlipidemia 4.  BMI of 40 kg/m   The patient is a 51 y.o. female with the indicated medical history here for recommendations regarding syncope.  The patient had been seen by her primary care provider last month.  She had an episode of syncope while she was having low blood sugars.  Apparently she was sleeping and was awoken by her dog.  She got up and felt nauseated.  She then stood up  and lost consciousness.  She did hit her face on the edge of a couch.  She was slightly confused at that time.  Her PCP thought this was likely a vasovagal episode.  She is referred to cardiology for further evaluation.  Laboratories were drawn including a TSH and CMP as well as CBC which were well within normal limits.  She saw neurology and EEG was ordered but it was slightly thought that vasovagal syncope was the etiology.  This episode happened about a month ago.  She has had no recurrent episodes.  She denies any chest pain, palpitations, paroxysmal, dyspnea, orthopnea.  She has adjusted her diet and is on Mounjaro for weight loss.  She has lost about 40 pounds.  She is otherwise well without complaints.        Previous Medical History: Past Medical History:  Diagnosis Date   Anemia    dx 02/2021- no meds at this time (07/15/2021)   Cancer ([place]) 2019   Basal cell left ear   Cervical stenosis (uterine cervix) 03/26/2017   Diabetes mellitus without complication ([place])    on meds   Elevated hemoglobin A1c 03/10/2017   level 7.1   Hyperlipidemia    on meds   Hypertension    on meds     Current Medications: Current Meds  Medication Sig   Continuous Blood Gluc Receiver (FREESTYLE LIBRE 2 READER) DEVI 1 Act  by Does not apply route daily.   Continuous Blood Gluc Sensor (FREESTYLE LIBRE 2 SENSOR) MISC 1 Act by Does not apply route daily.   ibuprofen (ADVIL) 200 MG tablet Take 200 mg by mouth every 6 (six) hours as needed.   KLOR-CON M15 15 MEQ tablet Take 1 tablet by mouth twice daily   levocetirizine (XYZAL) 5 MG tablet Take 1 tablet (5 mg total) by mouth every evening.   lisinopril-hydrochlorothiazide (ZESTORETIC) 20-12.5 MG tablet Take 2 tablets by mouth daily.   metFORMIN (GLUCOPHAGE-XR) 500 MG 24 hr tablet Take 1 tablet (500 mg total) by mouth every morning.   norethindrone (HEATHER) 0.35 MG tablet TAKE 1 TABLET BY MOUTH DAILY.   OneTouch Delica Lancets 76P MISC See admin  instructions.   ONETOUCH VERIO test strip USE 1 STRIP TO CHECK GLUCOSE ONCE DAILY   rosuvastatin (CRESTOR) 5 MG tablet Take 1 tablet (5 mg total) by mouth daily.   tirzepatide (MOUNJARO) 7.5 MG/0.5ML Pen Inject 7.5 mg into the skin once a week.     Allergies:    Patient has no known allergies.   Social History:   Social History   Tobacco Use   Smoking status: Former    Packs/day: 0.50    Years: 16.00    Pack years: 8.00    Types: Cigarettes    Quit date: 2014    Years since quitting: 9.1   Smokeless tobacco: Never  Vaping Use   Vaping Use: Never used  Substance Use Topics   Alcohol use: Not Currently    Alcohol/week: 0.0 standard drinks    Comment: rarely   Drug use: No     Family Hx: Family History  Problem Relation Age of Onset   Diabetes Mother    Glaucoma Father    Breast cancer Maternal Grandmother        Late 70's   Diabetes Other        mother's side   Colon polyps Neg Hx    Colon cancer Neg Hx    Esophageal cancer Neg Hx    Stomach cancer Neg Hx    Rectal cancer Neg Hx      Review of Systems:   Please see the history of present illness.    All other systems reviewed and are negative.     EKGs/Labs/Other Test Reviewed:    EKG: EKG today demonstrates sinus rhythm; EKG from June 2022 demonstrates sinus rhythm  Prior CV studies: None available  Imaging studies that I have independently reviewed today: None relevant  Recent Labs: 03/12/2021: Pro B Natriuretic peptide (BNP) 15.0 10/17/2021: ALT 14; BUN 12; Creatinine, Ser 0.81; Hemoglobin 12.9; Platelets 227.0; Potassium 3.7; Sodium 141; TSH 1.72   Recent Lipid Panel Lab Results  Component Value Date/Time   CHOL 85 07/01/2021 08:55 AM   TRIG 102.0 07/01/2021 08:55 AM   HDL 36.20 (L) 07/01/2021 08:55 AM   LDLCALC 28 07/01/2021 08:55 AM    Risk Assessment/Calculations:          Physical Exam:    VS:  BP 118/76    Pulse 94    Ht 5\' 5"  (1.651 m)    Wt 244 lb (110.7 kg)    BMI 40.60 kg/m     Wt Readings from Last 3 Encounters:  11/08/21 244 lb (110.7 kg)  10/18/21 248 lb 12.8 oz (112.9 kg)  10/17/21 249 lb (112.9 kg)    GENERAL:  No apparent distress, AOx3 HEENT:  No carotid bruits, +2 carotid impulses, no  scleral icterus CAR: RRR  no murmurs, gallops, rubs, or thrills RES:  Clear to auscultation bilaterally ABD:  Soft, nontender, nondistended, positive bowel sounds x 4 VASC:  +2 radial pulses, +2 carotid pulses, palpable pedal pulses NEURO:  CN 2-12 grossly intact; motor and sensory grossly intact PSYCH:  No active depression or anxiety EXT:  No edema, ecchymosis, or cyanosis  Signed, Early Osmond, MD  11/08/2021 8:17 AM    [facility] [place], [place],   [address] Phone: [phone]; Fax: [phone]   Note:  This document was prepared using Dragon voice recognition software and may include unintentional dictation errors.

## 2021-11-08 ENCOUNTER — Ambulatory Visit (INDEPENDENT_AMBULATORY_CARE_PROVIDER_SITE_OTHER): Payer: BC Managed Care – PPO

## 2021-11-08 ENCOUNTER — Encounter: Payer: Self-pay | Admitting: Internal Medicine

## 2021-11-08 ENCOUNTER — Ambulatory Visit: Payer: BC Managed Care – PPO | Admitting: Internal Medicine

## 2021-11-08 ENCOUNTER — Other Ambulatory Visit: Payer: Self-pay

## 2021-11-08 VITALS — BP 118/76 | HR 94 | Ht 65.0 in | Wt 244.0 lb

## 2021-11-08 DIAGNOSIS — R55 Syncope and collapse: Secondary | ICD-10-CM | POA: Diagnosis not present

## 2021-11-08 DIAGNOSIS — E785 Hyperlipidemia, unspecified: Secondary | ICD-10-CM

## 2021-11-08 DIAGNOSIS — E118 Type 2 diabetes mellitus with unspecified complications: Secondary | ICD-10-CM

## 2021-11-08 DIAGNOSIS — Z6841 Body Mass Index (BMI) 40.0 and over, adult: Secondary | ICD-10-CM

## 2021-11-08 DIAGNOSIS — I10 Essential (primary) hypertension: Secondary | ICD-10-CM | POA: Diagnosis not present

## 2021-11-08 MED ORDER — ASPIRIN EC 81 MG PO TBEC: 81.0000 mg | DELAYED_RELEASE_TABLET | Freq: Every day | ORAL | 3 refills | Status: AC

## 2021-11-08 MED ORDER — EMPAGLIFLOZIN 10 MG PO TABS: 10.0000 mg | ORAL_TABLET | Freq: Every day | ORAL | 11 refills | Status: DC

## 2021-11-08 NOTE — Patient Instructions (Signed)
Medication Instructions:   Your physician has recommended you make the following change in your medication:  1.) start Jardiance 10 mg daily 2.) start aspirin 81 mg daily  *If you need a refill on your cardiac medications before your next appointment, please call your pharmacy*   Lab Work: none   Testing/Procedures: Your physician has requested that you have an echocardiogram. Echocardiography is a painless test that uses sound waves to create images of your heart. It provides your doctor with information about the size and shape of your heart and how well your hearts chambers and valves are working. This procedure takes approximately one hour. There are no restrictions for this procedure.  Zio Heart Monitor - see instructions below.  Follow-Up: At [facility], you and your health needs are our priority.  As part of our continuing mission to provide you with exceptional heart care, we have created designated Provider Care Teams.  These Care Teams include your primary Cardiologist (physician) and Advanced Practice Providers (APPs -  Physician Assistants and Nurse Practitioners) who all work together to provide you with the care you need, when you need it.   Your next appointment:   12 month(s)  The format for your next appointment:   In Person  Provider:   None     Other Instructions ZIO XT- Long Term Monitor Instructions  Your physician has requested you wear a ZIO patch monitor for 3 days.  This is a single patch monitor. [employer] supplies one patch monitor per enrollment. Additional stickers are not available. Please do not apply patch if you will be having a Nuclear Stress Test,  Echocardiogram, Cardiac CT, MRI, or Chest Xray during the period you would be wearing the  monitor. The patch cannot be worn during these tests. You cannot remove and re-apply the  ZIO XT patch monitor.  Your ZIO patch monitor will be mailed 3 day USPS to your address on file. It may take 3-5  days  to receive your monitor after you have been enrolled.  Once you have received your monitor, please review the enclosed instructions. Your monitor  has already been registered assigning a specific monitor serial # to you.  Billing and Patient Assistance Program Information  We have supplied [employer] with any of your insurance information on file for billing purposes. [employer] offers a sliding scale Patient Assistance Program for patients that do not have  insurance, or whose insurance does not completely cover the cost of the ZIO monitor.  You must apply for the Patient Assistance Program to qualify for this discounted rate.  To apply, please call [employer] at [phone], select option 4, select option 2, ask to apply for  Patient Assistance Program. Theodore Demark will ask your household income, and how many people  are in your household. They will quote your out-of-pocket cost based on that information.  [employer] will also be able to set up a 30-month, interest-free payment plan if needed.  Applying the monitor   Shave hair from upper left chest.  Hold abrader disc by orange tab. Rub abrader in 40 strokes over the upper left chest as  indicated in your monitor instructions.  Clean area with 4 enclosed alcohol pads. Let dry.  Apply patch as indicated in monitor instructions. Patch will be placed under collarbone on left  side of chest with arrow pointing upward.  Rub patch adhesive wings for 2 minutes. Remove white label marked "1". Remove the white  label marked "2". Rub patch adhesive wings for  2 additional minutes.  While looking in a mirror, press and release button in center of patch. A small green light will  flash 3-4 times. This will be your only indicator that the monitor has been turned on.  Do not shower for the first 24 hours. You may shower after the first 24 hours.  Press the button if you feel a symptom. You will hear a small click. Record Date, Time and  Symptom in the  Patient Logbook.  When you are ready to remove the patch, follow instructions on the last 2 pages of Patient  Logbook. Stick patch monitor onto the last page of Patient Logbook.  Place Patient Logbook in the blue and white box. Use locking tab on box and tape box closed  securely. The blue and white box has prepaid postage on it. Please place it in the mailbox as  soon as possible. Your physician should have your test results approximately 7 days after the  monitor has been mailed back to [facility].  Call [place] at [phone] if you have questions regarding  your ZIO XT patch monitor. Call them immediately if you see an orange light blinking on your  monitor.  If your monitor falls off in less than 4 days, contact our Monitor department at [phone].  If your monitor becomes loose or falls off after 4 days call [employer] at [phone] for  suggestions on securing your monitor

## 2021-11-08 NOTE — Progress Notes (Unsigned)
Enrolled pt for 3 day Zio XT to be mailed to home address.

## 2021-11-13 ENCOUNTER — Ambulatory Visit: Payer: BC Managed Care – PPO | Admitting: Neurology

## 2021-11-13 ENCOUNTER — Other Ambulatory Visit: Payer: Self-pay

## 2021-11-13 DIAGNOSIS — R55 Syncope and collapse: Secondary | ICD-10-CM | POA: Diagnosis not present

## 2021-11-15 NOTE — Procedures (Signed)
ELECTROENCEPHALOGRAM REPORT  Date of Study: 11/13/2021  Patient's Name: Suzanne Bartlett MRN: [id number] Date of Birth: [birth year]  Referring Provider: Dr. Ellouise Newer  Clinical History: This is a 51 year old woman with episode of loss of consciousness. EEG for classification.  Medications: ADVIL 200 MG tablet XYZAL 5 MG tablet ZESTORETIC 20-12.5 MG tablet GLUCOPHAGE-XR 500 MG 24 hr tablet HEATHER 0.35 MG tablet KLOR-CON M15 15 MEQ tablet CRESTOR 5 MG tablet MOUNJARO 10 MG/0.5ML Pen  Technical Summary: A multichannel digital EEG recording measured by the international 10-20 system with electrodes applied with paste and impedances below 5000 ohms performed in our laboratory with EKG monitoring in an awake and drowsy patient.  Hyperventilation was not performed. Photic stimulation was performed.  The digital EEG was referentially recorded, reformatted, and digitally filtered in a variety of bipolar and referential montages for optimal display.    Description: The patient is awake and drowsy during the recording.  During maximal wakefulness, there is a symmetric, medium voltage 10 Hz posterior dominant rhythm that attenuates with eye opening.  The record is symmetric.  During drowsiness, there is an increase in theta slowing of the background. Sleep was not captured.  Photic stimulation did not elicit any abnormalities.  There were no epileptiform discharges or electrographic seizures seen.    EKG lead was unremarkable.  Impression: This awake and drowsy EEG is normal.    Clinical Correlation: A normal EEG does not exclude a clinical diagnosis of epilepsy.  If further clinical questions remain, prolonged EEG may be helpful.  Clinical correlation is advised.   Ellouise Newer, M.D.

## 2021-11-19 ENCOUNTER — Telehealth: Payer: Self-pay

## 2021-11-19 NOTE — Telephone Encounter (Signed)
-----   Message from Cameron Sprang, MD sent at 11/19/2021  8:34 AM EST ----- Pls let her know the brain wave test was normal, follow-up as needed, call for any changes in symptoms. Thanks

## 2021-11-19 NOTE — Telephone Encounter (Signed)
Pt an informed brain wave test was normal, follow-up as needed, call for any changes in symptoms

## 2021-11-21 ENCOUNTER — Other Ambulatory Visit: Payer: Self-pay

## 2021-11-21 ENCOUNTER — Ambulatory Visit (HOSPITAL_COMMUNITY): Payer: BC Managed Care – PPO | Attending: Cardiology

## 2021-11-21 DIAGNOSIS — R55 Syncope and collapse: Secondary | ICD-10-CM | POA: Diagnosis not present

## 2021-11-21 DIAGNOSIS — E118 Type 2 diabetes mellitus with unspecified complications: Secondary | ICD-10-CM | POA: Diagnosis not present

## 2021-11-21 DIAGNOSIS — Z6841 Body Mass Index (BMI) 40.0 and over, adult: Secondary | ICD-10-CM

## 2021-11-21 DIAGNOSIS — I10 Essential (primary) hypertension: Secondary | ICD-10-CM

## 2021-11-21 DIAGNOSIS — E785 Hyperlipidemia, unspecified: Secondary | ICD-10-CM | POA: Insufficient documentation

## 2021-11-21 LAB — ECHOCARDIOGRAM COMPLETE
Area-P 1/2: 4.93 cm2
S' Lateral: 2.2 cm

## 2021-11-25 DIAGNOSIS — I10 Essential (primary) hypertension: Secondary | ICD-10-CM | POA: Diagnosis not present

## 2021-11-25 DIAGNOSIS — R55 Syncope and collapse: Secondary | ICD-10-CM | POA: Diagnosis not present

## 2021-11-25 DIAGNOSIS — E785 Hyperlipidemia, unspecified: Secondary | ICD-10-CM | POA: Diagnosis not present

## 2021-12-31 ENCOUNTER — Ambulatory Visit: Payer: BC Managed Care – PPO | Admitting: Internal Medicine

## 2022-01-18 ENCOUNTER — Other Ambulatory Visit: Payer: Self-pay | Admitting: Internal Medicine

## 2022-01-18 DIAGNOSIS — T502X5A Adverse effect of carbonic-anhydrase inhibitors, benzothiadiazides and other diuretics, initial encounter: Secondary | ICD-10-CM

## 2022-01-18 DIAGNOSIS — I10 Essential (primary) hypertension: Secondary | ICD-10-CM

## 2022-02-11 ENCOUNTER — Ambulatory Visit: Payer: BC Managed Care – PPO | Admitting: Internal Medicine

## 2022-02-11 ENCOUNTER — Encounter: Payer: Self-pay | Admitting: Internal Medicine

## 2022-02-11 VITALS — BP 134/76 | HR 83 | Temp 98.7°F | Resp 16 | Ht 65.0 in | Wt 226.0 lb

## 2022-02-11 DIAGNOSIS — Z23 Encounter for immunization: Secondary | ICD-10-CM

## 2022-02-11 DIAGNOSIS — I10 Essential (primary) hypertension: Secondary | ICD-10-CM

## 2022-02-11 DIAGNOSIS — E118 Type 2 diabetes mellitus with unspecified complications: Secondary | ICD-10-CM

## 2022-02-11 LAB — BASIC METABOLIC PANEL
BUN: 14 mg/dL (ref 6–23)
CO2: 31 mEq/L (ref 19–32)
Calcium: 10.2 mg/dL (ref 8.4–10.5)
Chloride: 103 mEq/L (ref 96–112)
Creatinine, Ser: 0.78 mg/dL (ref 0.40–1.20)
GFR: 87.07 mL/min (ref >60.00)
Glucose, Bld: 99 mg/dL (ref 70–99)
Potassium: 3.9 mEq/L (ref 3.5–5.1)
Sodium: 141 mEq/L (ref 135–145)

## 2022-02-11 LAB — HEMOGLOBIN A1C: Hgb A1c MFr Bld: 5.6 % (ref 4.6–6.5)

## 2022-02-11 MED ORDER — LISINOPRIL-HYDROCHLOROTHIAZIDE 20-12.5 MG PO TABS: 2.0000 | ORAL_TABLET | Freq: Every day | ORAL | 1 refills | Status: DC

## 2022-02-11 NOTE — Progress Notes (Signed)
Subjective:  Patient ID: Suzanne Bartlett, female    DOB: December 02, 1970  Age: 51 y.o. MRN: [id number]  CC: Hypertension and Diabetes   HPI Suzanne Bartlett presents for f/up -  She is active and denies chest pain, shortness of breath, diaphoresis, dizziness, lightheadedness, or edema.  Outpatient Medications Prior to Visit  Medication Sig Dispense Refill   aspirin EC 81 MG tablet Take 1 tablet (81 mg total) by mouth daily. Swallow whole. 90 tablet 3   Continuous Blood Gluc Receiver (FREESTYLE LIBRE 2 READER) DEVI 1 Act by Does not apply route daily. 2 each 5   Continuous Blood Gluc Sensor (FREESTYLE LIBRE 2 SENSOR) MISC 1 Act by Does not apply route daily. 2 each 5   empagliflozin (JARDIANCE) 10 MG TABS tablet Take 1 tablet (10 mg total) by mouth daily before breakfast. 30 tablet 11   KLOR-CON M15 15 MEQ tablet Take 1 tablet by mouth twice daily 180 tablet 0   levocetirizine (XYZAL) 5 MG tablet Take 1 tablet (5 mg total) by mouth every evening. 90 tablet 1   norethindrone (HEATHER) 0.35 MG tablet TAKE 1 TABLET BY MOUTH DAILY. 84 tablet 3   OneTouch Delica Lancets 44R MISC See admin instructions.     ONETOUCH VERIO test strip USE 1 STRIP TO CHECK GLUCOSE ONCE DAILY  11   rosuvastatin (CRESTOR) 5 MG tablet Take 1 tablet (5 mg total) by mouth daily. 90 tablet 1   ibuprofen (ADVIL) 200 MG tablet Take 200 mg by mouth every 6 (six) hours as needed.     lisinopril-hydrochlorothiazide (ZESTORETIC) 20-12.5 MG tablet Take 2 tablets by mouth daily. 180 tablet 1   metFORMIN (GLUCOPHAGE-XR) 500 MG 24 hr tablet Take 1 tablet (500 mg total) by mouth every morning. 90 tablet 1   tirzepatide (MOUNJARO) 7.5 MG/0.5ML Pen Inject 7.5 mg into the skin once a week. 6 mL 0   MOUNJARO 10 MG/0.5ML Pen 10 mg once a week.     No facility-administered medications prior to visit.    ROS Review of Systems  Constitutional: Negative.  Negative for diaphoresis and fatigue.  HENT: Negative.    Eyes:  Negative.   Respiratory: Negative.  Negative for cough, chest tightness, shortness of breath and wheezing.   Cardiovascular:  Negative for chest pain, palpitations and leg swelling.  Gastrointestinal:  Negative for abdominal pain, diarrhea, nausea and vomiting.  Endocrine: Negative.   Genitourinary: Negative.  Negative for difficulty urinating and dysuria.  Musculoskeletal: Negative.   Skin: Negative.   Neurological:  Negative for dizziness, weakness, light-headedness and headaches.  Hematological:  Negative for adenopathy. Does not bruise/bleed easily.  Psychiatric/Behavioral: Negative.     Objective:  BP 134/76 (BP Location: Left Arm, Patient Position: Sitting, Cuff Size: Large)   Pulse 83   Temp 98.7 F (37.1 C) (Oral)   Resp 16   Ht '5\' 5"'$  (1.651 m)   Wt 226 lb (102.5 kg)   SpO2 98%   BMI 37.61 kg/m   BP Readings from Last 3 Encounters:  02/11/22 134/76  11/08/21 118/76  10/18/21 121/68    Wt Readings from Last 3 Encounters:  02/11/22 226 lb (102.5 kg)  11/08/21 244 lb (110.7 kg)  10/18/21 248 lb 12.8 oz (112.9 kg)    Physical Exam Vitals reviewed.  HENT:     Nose: Nose normal.     Mouth/Throat:     Mouth: Mucous membranes are moist.  Eyes:     General: No scleral icterus.  Conjunctiva/sclera: Conjunctivae normal.  Cardiovascular:     Rate and Rhythm: Normal rate and regular rhythm.     Heart sounds: No murmur heard. Pulmonary:     Effort: Pulmonary effort is normal.     Breath sounds: No stridor. No wheezing, rhonchi or rales.  Abdominal:     General: Abdomen is flat.     Palpations: There is no mass.     Tenderness: There is no abdominal tenderness. There is no guarding.     Hernia: No hernia is present.  Musculoskeletal:        General: Normal range of motion.     Cervical back: Neck supple.     Right lower leg: No edema.     Left lower leg: No edema.  Lymphadenopathy:     Cervical: No cervical adenopathy.  Skin:    General: Skin is warm and  dry.  Neurological:     General: No focal deficit present.     Mental Status: She is alert.  Psychiatric:        Mood and Affect: Mood normal.        Behavior: Behavior normal.    Lab Results  Component Value Date   WBC 7.3 10/17/2021   HGB 12.9 10/17/2021   HCT 38.0 10/17/2021   PLT 227.0 10/17/2021   GLUCOSE 99 02/11/2022   CHOL 85 07/01/2021   TRIG 102.0 07/01/2021   HDL 36.20 (L) 07/01/2021   LDLCALC 28 07/01/2021   ALT 14 10/17/2021   AST 15 10/17/2021   NA 141 02/11/2022   K 3.9 02/11/2022   CL 103 02/11/2022   CREATININE 0.78 02/11/2022   BUN 14 02/11/2022   CO2 31 02/11/2022   TSH 1.72 10/17/2021   HGBA1C 5.6 02/11/2022   MICROALBUR 8.7 (H) 07/01/2021    MM DIAG BREAST TOMO UNI LEFT  Result Date: 12/24/2020 CLINICAL DATA:  Recall from 2D screening mammography, possible focal asymmetry involving the OUTER LEFT breast at POSTERIOR depth. EXAM: DIGITAL DIAGNOSTIC UNILATERAL LEFT MAMMOGRAM WITH TOMOSYNTHESIS TECHNIQUE: Left digital diagnostic mammography and breast tomosynthesis was performed. COMPARISON:  Previous exam(s). ACR Breast Density Category b: There are scattered areas of fibroglandular density. FINDINGS: Spot-compression CC and MLO views area of concern in the OUTER breast were obtained. The focal asymmetry questioned on screening mammography disperses with compression, indicating overlapping fibroglandular tissue. There is no underlying mass or architectural distortion. IMPRESSION: No mammographic evidence of malignancy involving the LEFT breast. RECOMMENDATION: Screening mammogram in one year.(Code:SM-B-01Y) I have discussed the findings and recommendations with the patient. If applicable, a reminder letter will be sent to the patient regarding the next appointment. BI-RADS CATEGORY  1: Negative. Electronically Signed   By: Suzanne Bartlett M.D.   On: 12/24/2020 09:44    Assessment & Plan:   Suzanne Bartlett was seen today for hypertension and diabetes.  Diagnoses and  all orders for this visit:  Primary hypertension- Her blood pressure is adequately well controlled. -     Basic metabolic panel; Future -     Basic metabolic panel -     lisinopril-hydrochlorothiazide (ZESTORETIC) 20-12.5 MG tablet; Take 2 tablets by mouth daily.  Type II diabetes mellitus with manifestations ([place])- Her A1c is down to 5.6%.  I recommended that she stop taking metformin. -     Basic metabolic panel; Future -     Hemoglobin A1c; Future -     HM Diabetes Foot Exam -     Hemoglobin A1c -     Basic  metabolic panel -     lisinopril-hydrochlorothiazide (ZESTORETIC) 20-12.5 MG tablet; Take 2 tablets by mouth daily.  Need for vaccination -     Pneumococcal conjugate vaccine 20-valent  Other orders -     Varicella-zoster vaccine IM (Shingrix)   I have discontinued Chauncey Cruel. Nardozzi's metFORMIN, ibuprofen, and tirzepatide. I am also having her maintain her OneTouch Verio, OneTouch Delica Lancets 27M, rosuvastatin, FreeStyle Libre 2 Sensor, [employer] 2 Reader, levocetirizine, norethindrone, aspirin EC, empagliflozin, Klor-Con M15, Mounjaro, and lisinopril-hydrochlorothiazide.  Meds ordered this encounter  Medications   lisinopril-hydrochlorothiazide (ZESTORETIC) 20-12.5 MG tablet    Sig: Take 2 tablets by mouth daily.    Dispense:  180 tablet    Refill:  1     Follow-up: Return in about 6 months (around 08/14/2022).  Scarlette Calico, MD

## 2022-02-11 NOTE — Patient Instructions (Signed)

## 2022-02-12 LAB — HM DIABETES EYE EXAM

## 2022-03-24 DIAGNOSIS — H04203 Unspecified epiphora, bilateral lacrimal glands: Secondary | ICD-10-CM | POA: Diagnosis not present

## 2022-04-23 ENCOUNTER — Other Ambulatory Visit: Payer: Self-pay | Admitting: Internal Medicine

## 2022-04-23 DIAGNOSIS — E876 Hypokalemia: Secondary | ICD-10-CM

## 2022-04-23 DIAGNOSIS — I10 Essential (primary) hypertension: Secondary | ICD-10-CM

## 2022-05-05 DIAGNOSIS — H04563 Stenosis of bilateral lacrimal punctum: Secondary | ICD-10-CM | POA: Diagnosis not present

## 2022-05-20 DIAGNOSIS — M7551 Bursitis of right shoulder: Secondary | ICD-10-CM | POA: Diagnosis not present

## 2022-05-21 ENCOUNTER — Other Ambulatory Visit: Payer: Self-pay | Admitting: Internal Medicine

## 2022-08-18 ENCOUNTER — Encounter: Payer: Self-pay | Admitting: Internal Medicine

## 2022-08-18 ENCOUNTER — Ambulatory Visit (INDEPENDENT_AMBULATORY_CARE_PROVIDER_SITE_OTHER): Payer: BC Managed Care – PPO

## 2022-08-18 ENCOUNTER — Ambulatory Visit: Payer: BC Managed Care – PPO | Admitting: Internal Medicine

## 2022-08-18 VITALS — BP 118/72 | HR 102 | Temp 98.7°F | Ht 65.0 in | Wt 208.0 lb

## 2022-08-18 DIAGNOSIS — T502X5A Adverse effect of carbonic-anhydrase inhibitors, benzothiadiazides and other diuretics, initial encounter: Secondary | ICD-10-CM

## 2022-08-18 DIAGNOSIS — E785 Hyperlipidemia, unspecified: Secondary | ICD-10-CM

## 2022-08-18 DIAGNOSIS — E118 Type 2 diabetes mellitus with unspecified complications: Secondary | ICD-10-CM

## 2022-08-18 DIAGNOSIS — Z23 Encounter for immunization: Secondary | ICD-10-CM | POA: Diagnosis not present

## 2022-08-18 DIAGNOSIS — E876 Hypokalemia: Secondary | ICD-10-CM | POA: Diagnosis not present

## 2022-08-18 DIAGNOSIS — R059 Cough, unspecified: Secondary | ICD-10-CM | POA: Diagnosis not present

## 2022-08-18 DIAGNOSIS — R051 Acute cough: Secondary | ICD-10-CM

## 2022-08-18 DIAGNOSIS — J22 Unspecified acute lower respiratory infection: Secondary | ICD-10-CM

## 2022-08-18 DIAGNOSIS — I10 Essential (primary) hypertension: Secondary | ICD-10-CM | POA: Diagnosis not present

## 2022-08-18 LAB — URINALYSIS, ROUTINE W REFLEX MICROSCOPIC
Bilirubin Urine: NEGATIVE
Hgb urine dipstick: NEGATIVE
Ketones, ur: NEGATIVE
Leukocytes,Ua: NEGATIVE
Nitrite: NEGATIVE
RBC / HPF: NONE SEEN (ref 0–?)
Specific Gravity, Urine: 1.01 (ref 1.000–1.030)
Total Protein, Urine: NEGATIVE
Urine Glucose: >=1000 — AB
Urobilinogen, UA: 0.2 (ref 0.0–1.0)
pH: 7 (ref 5.0–8.0)

## 2022-08-18 LAB — LIPID PANEL
Cholesterol: 156 mg/dL (ref 0–200)
HDL: 46.4 mg/dL (ref >39.00)
LDL Cholesterol: 92 mg/dL (ref 0–99)
NonHDL: 109.57
Total CHOL/HDL Ratio: 3
Triglycerides: 89 mg/dL (ref 0.0–149.0)
VLDL: 17.8 mg/dL (ref 0.0–40.0)

## 2022-08-18 LAB — CBC WITH DIFFERENTIAL/PLATELET
Basophils Absolute: 0 10*3/uL (ref 0.0–0.1)
Basophils Relative: 0.3 % (ref 0.0–3.0)
Eosinophils Absolute: 0.1 10*3/uL (ref 0.0–0.7)
Eosinophils Relative: 0.5 % (ref 0.0–5.0)
HCT: 41.1 % (ref 36.0–46.0)
Hemoglobin: 14.5 g/dL (ref 12.0–15.0)
Lymphocytes Relative: 16.6 % (ref 12.0–46.0)
Lymphs Abs: 1.8 10*3/uL (ref 0.7–4.0)
MCHC: 35.3 g/dL (ref 30.0–36.0)
MCV: 88.2 fl (ref 78.0–100.0)
Monocytes Absolute: 0.8 10*3/uL (ref 0.1–1.0)
Monocytes Relative: 7.2 % (ref 3.0–12.0)
Neutro Abs: 8 10*3/uL — ABNORMAL HIGH (ref 1.4–7.7)
Neutrophils Relative %: 75.4 % (ref 43.0–77.0)
Platelets: 238 10*3/uL (ref 150.0–400.0)
RBC: 4.66 Mil/uL (ref 3.87–5.11)
RDW: 13.2 % (ref 11.5–15.5)
WBC: 10.7 10*3/uL — ABNORMAL HIGH (ref 4.0–10.5)

## 2022-08-18 LAB — BASIC METABOLIC PANEL
BUN: 14 mg/dL (ref 6–23)
CO2: 31 mEq/L (ref 19–32)
Calcium: 9.8 mg/dL (ref 8.4–10.5)
Chloride: 97 mEq/L (ref 96–112)
Creatinine, Ser: 0.79 mg/dL (ref 0.40–1.20)
GFR: 85.44 mL/min (ref >60.00)
Glucose, Bld: 84 mg/dL (ref 70–99)
Potassium: 3.4 mEq/L — ABNORMAL LOW (ref 3.5–5.1)
Sodium: 137 mEq/L (ref 135–145)

## 2022-08-18 LAB — POC COVID19 BINAXNOW: SARS Coronavirus 2 Ag: NEGATIVE

## 2022-08-18 LAB — MICROALBUMIN / CREATININE URINE RATIO
Creatinine,U: 93.2 mg/dL
Microalb Creat Ratio: 0.8 mg/g (ref 0.0–30.0)
Microalb, Ur: <0.7 mg/dL (ref 0.0–1.9)

## 2022-08-18 LAB — POCT INFLUENZA A/B
Influenza A, POC: NEGATIVE
Influenza B, POC: NEGATIVE

## 2022-08-18 LAB — HEMOGLOBIN A1C: Hgb A1c MFr Bld: 5.9 % (ref 4.6–6.5)

## 2022-08-18 MED ORDER — AZITHROMYCIN 500 MG PO TABS: 500.0000 mg | ORAL_TABLET | Freq: Every day | ORAL | 0 refills | Status: AC

## 2022-08-18 MED ORDER — ROSUVASTATIN CALCIUM 5 MG PO TABS: 5.0000 mg | ORAL_TABLET | Freq: Every day | ORAL | 1 refills | Status: DC

## 2022-08-18 MED ORDER — MOUNJARO 10 MG/0.5ML ~~LOC~~ SOAJ: SUBCUTANEOUS | 0 refills | Status: DC

## 2022-08-18 NOTE — Progress Notes (Unsigned)
Subjective:  Patient ID: Suzanne Bartlett, female    DOB: 03/07/1971  Age: 51 y.o. MRN: [id number]  CC: URI   HPI Suzanne Bartlett presents for f/up -  She complains of a 3-day history of cough productive of green phlegm with sore throat, postnasal drip, and muffled sensation in her ears.   Outpatient Medications Prior to Visit  Medication Sig Dispense Refill   aspirin EC 81 MG tablet Take 1 tablet (81 mg total) by mouth daily. Swallow whole. 90 tablet 3   Continuous Blood Gluc Receiver (FREESTYLE LIBRE 2 READER) DEVI 1 Act by Does not apply route daily. 2 each 5   Continuous Blood Gluc Sensor (FREESTYLE LIBRE 2 SENSOR) MISC 1 Act by Does not apply route daily. 2 each 5   empagliflozin (JARDIANCE) 10 MG TABS tablet Take 1 tablet (10 mg total) by mouth daily before breakfast. 30 tablet 11   KLOR-CON M15 15 MEQ tablet Take 1 tablet by mouth twice daily 180 tablet 0   levocetirizine (XYZAL) 5 MG tablet Take 1 tablet (5 mg total) by mouth every evening. 90 tablet 1   norethindrone (HEATHER) 0.35 MG tablet TAKE 1 TABLET BY MOUTH DAILY. 84 tablet 3   OneTouch Delica Lancets 27O MISC See admin instructions.     ONETOUCH VERIO test strip USE 1 STRIP TO CHECK GLUCOSE ONCE DAILY  11   lisinopril-hydrochlorothiazide (ZESTORETIC) 20-12.5 MG tablet Take 2 tablets by mouth daily. 180 tablet 1   MOUNJARO 10 MG/0.5ML Pen INJECT '10MG'$  SUBCUTANEOUSLY ONCE A WEEK 6 mL 0   rosuvastatin (CRESTOR) 5 MG tablet Take 1 tablet (5 mg total) by mouth daily. 90 tablet 1   No facility-administered medications prior to visit.    ROS Review of Systems  Constitutional:  Negative for chills, diaphoresis, fatigue and fever.  HENT:  Positive for ear pain and sore throat. Negative for trouble swallowing and voice change.   Eyes: Negative.   Respiratory:  Positive for cough. Negative for chest tightness, shortness of breath and wheezing.   Cardiovascular:  Negative for chest pain, palpitations and leg  swelling.  Gastrointestinal:  Negative for abdominal pain, diarrhea, nausea and vomiting.  Endocrine: Negative.   Genitourinary: Negative.  Negative for difficulty urinating.  Musculoskeletal: Negative.   Skin: Negative.   Neurological: Negative.  Negative for dizziness.  Hematological:  Negative for adenopathy. Does not bruise/bleed easily.  Psychiatric/Behavioral: Negative.      Objective:  BP 118/72 (BP Location: Left Arm, Patient Position: Sitting, Cuff Size: Large)   Pulse (!) 102   Temp 98.7 F (37.1 C) (Oral)   Ht '5\' 5"'$  (1.651 m)   Wt 208 lb (94.3 kg)   LMP 02/19/2021 (Exact Date)   SpO2 95%   BMI 34.61 kg/m   BP Readings from Last 3 Encounters:  08/18/22 118/72  02/11/22 134/76  11/08/21 118/76    Wt Readings from Last 3 Encounters:  08/18/22 208 lb (94.3 kg)  02/11/22 226 lb (102.5 kg)  11/08/21 244 lb (110.7 kg)    Physical Exam Vitals reviewed.  Constitutional:      Appearance: She is not ill-appearing.  HENT:     Mouth/Throat:     Mouth: Mucous membranes are moist.  Eyes:     General: No scleral icterus.    Conjunctiva/sclera: Conjunctivae normal.  Cardiovascular:     Rate and Rhythm: Normal rate and regular rhythm.     Heart sounds: No murmur heard. Pulmonary:     Effort: Pulmonary effort  is normal.     Breath sounds: No stridor. No wheezing, rhonchi or rales.  Abdominal:     General: Abdomen is flat.     Palpations: There is no mass.     Tenderness: There is no abdominal tenderness. There is no guarding.     Hernia: No hernia is present.  Musculoskeletal:        General: Normal range of motion.     Cervical back: Neck supple.     Right lower leg: No edema.     Left lower leg: No edema.  Lymphadenopathy:     Cervical: No cervical adenopathy.  Skin:    General: Skin is warm and dry.  Neurological:     General: No focal deficit present.     Mental Status: She is alert.  Psychiatric:        Mood and Affect: Mood normal.        Behavior:  Behavior normal.     Lab Results  Component Value Date   WBC 10.7 (H) 08/18/2022   HGB 14.5 08/18/2022   HCT 41.1 08/18/2022   PLT 238.0 08/18/2022   GLUCOSE 84 08/18/2022   CHOL 156 08/18/2022   TRIG 89.0 08/18/2022   HDL 46.40 08/18/2022   LDLCALC 92 08/18/2022   ALT 14 10/17/2021   AST 15 10/17/2021   NA 137 08/18/2022   K 3.4 (L) 08/18/2022   CL 97 08/18/2022   CREATININE 0.79 08/18/2022   BUN 14 08/18/2022   CO2 31 08/18/2022   TSH 1.72 10/17/2021   HGBA1C 5.9 08/18/2022   MICROALBUR <0.7 08/18/2022    MM DIAG BREAST TOMO UNI LEFT  Result Date: 12/24/2020 CLINICAL DATA:  Recall from 2D screening mammography, possible focal asymmetry involving the OUTER LEFT breast at POSTERIOR depth. EXAM: DIGITAL DIAGNOSTIC UNILATERAL LEFT MAMMOGRAM WITH TOMOSYNTHESIS TECHNIQUE: Left digital diagnostic mammography and breast tomosynthesis was performed. COMPARISON:  Previous exam(s). ACR Breast Density Category b: There are scattered areas of fibroglandular density. FINDINGS: Spot-compression CC and MLO views area of concern in the OUTER breast were obtained. The focal asymmetry questioned on screening mammography disperses with compression, indicating overlapping fibroglandular tissue. There is no underlying mass or architectural distortion. IMPRESSION: No mammographic evidence of malignancy involving the LEFT breast. RECOMMENDATION: Screening mammogram in one year.(Code:SM-B-01Y) I have discussed the findings and recommendations with the patient. If applicable, a reminder letter will be sent to the patient regarding the next appointment. BI-RADS CATEGORY  1: Negative. Electronically Signed   By: Evangeline Dakin M.D.   On: 12/24/2020 09:44   DG Chest 2 View  Result Date: 08/18/2022 CLINICAL DATA:  Productive cough EXAM: CHEST - 2 VIEW COMPARISON:  None Available. FINDINGS: The heart size and mediastinal contours are within normal limits. Both lungs are clear. The visualized skeletal  structures are unremarkable except for degenerative changes throughout the spine. Trachea midline. IMPRESSION: No active cardiopulmonary disease. Electronically Signed   By: Jerilynn Mages.  Shick M.D.   On: 08/18/2022 08:59     Assessment & Plan:   Suzanne Bartlett was seen today for uri.  Diagnoses and all orders for this visit:  Primary hypertension- Her blood pressure is overcontrolled and she is hypokalemic.  I have asked her to stop taking the antihypertensives. -     Basic metabolic panel; Future -     Urinalysis, Routine w reflex microscopic; Future -     CBC with Differential/Platelet; Future -     CBC with Differential/Platelet -     Basic  metabolic panel -     Urinalysis, Routine w reflex microscopic  Type II diabetes mellitus with manifestations ([place])- Her blood sugar is well-controlled. -     Hemoglobin A1c; Future -     Microalbumin / creatinine urine ratio; Future -     MOUNJARO 10 MG/0.5ML Pen; INJECT '10MG'$  SUBCUTANEOUSLY ONCE A WEEK -     Hemoglobin A1c -     Microalbumin / creatinine urine ratio  Hyperlipidemia LDL goal <100- LDL goal achieved. Doing well on the statin  -     Lipid panel; Future -     rosuvastatin (CRESTOR) 5 MG tablet; Take 1 tablet (5 mg total) by mouth daily. -     Lipid panel  Diuretic-induced hypokalemia- Will discontinue the thiazide. -     Basic metabolic panel; Future -     Basic metabolic panel  Acute cough- Chest x-ray is negative for mass or infiltrate. -     DG Chest 2 View; Future -     POC COVID-19 -     POCT Influenza A/B  LRTI (lower respiratory tract infection) -     azithromycin (ZITHROMAX) 500 MG tablet; Take 1 tablet (500 mg total) by mouth daily for 3 days.  Other orders -     Zoster Recombinant (Shingrix )   I have discontinued Chauncey Cruel. [place]'s lisinopril-hydrochlorothiazide. I am also having her start on azithromycin. Additionally, I am having her maintain her OneTouch Verio, OneTouch Delica Lancets 50K, FreeStyle Libre 2 Sensor, [employer] 2 Reader, levocetirizine, norethindrone, aspirin EC, empagliflozin, Klor-Con M15, rosuvastatin, and Mounjaro.  Meds ordered this encounter  Medications   rosuvastatin (CRESTOR) 5 MG tablet    Sig: Take 1 tablet (5 mg total) by mouth daily.    Dispense:  90 tablet    Refill:  1   MOUNJARO 10 MG/0.5ML Pen    Sig: INJECT '10MG'$  SUBCUTANEOUSLY ONCE A WEEK    Dispense:  6 mL    Refill:  0   azithromycin (ZITHROMAX) 500 MG tablet    Sig: Take 1 tablet (500 mg total) by mouth daily for 3 days.    Dispense:  3 tablet    Refill:  0     Follow-up: Return in about 3 months (around 11/18/2022).  Scarlette Calico, MD

## 2022-08-18 NOTE — Patient Instructions (Signed)

## 2022-08-25 ENCOUNTER — Telehealth: Payer: Self-pay | Admitting: Internal Medicine

## 2022-08-25 ENCOUNTER — Other Ambulatory Visit: Payer: Self-pay | Admitting: Internal Medicine

## 2022-08-25 DIAGNOSIS — I10 Essential (primary) hypertension: Secondary | ICD-10-CM

## 2022-08-25 DIAGNOSIS — E876 Hypokalemia: Secondary | ICD-10-CM

## 2022-08-25 MED ORDER — INDAPAMIDE 1.25 MG PO TABS: 1.2500 mg | ORAL_TABLET | Freq: Every day | ORAL | 0 refills | Status: DC

## 2022-08-25 MED ORDER — KLOR-CON M15 15 MEQ PO TBCR: 1.0000 | EXTENDED_RELEASE_TABLET | Freq: Two times a day (BID) | ORAL | 0 refills | Status: DC

## 2022-08-25 NOTE — Telephone Encounter (Signed)
Called pt, LVM.   

## 2022-08-25 NOTE — Telephone Encounter (Signed)
PT calls today in regards to onset of BP symptoms over the weekend. PT stated that she had been doing better with regulating weight and BP for a bit and was advised to stop the Lisinopril RX during their last visit. Starting on Saturday PT was experiencing headaches and was monitoring BP with highest reading being 140/82. During this time PT had measured an added on 4 lbs of water weight.  PT ended up taking a whole Lisinopril tablet Saturday as well as a half on Sunday and one of her husbands lasix. PT was able to get symptoms regulated.  She is wanting to know now how to go forward with her BP management.  CB: [phone]

## 2022-08-29 ENCOUNTER — Telehealth: Payer: Self-pay | Admitting: Internal Medicine

## 2022-08-29 ENCOUNTER — Other Ambulatory Visit: Payer: Self-pay | Admitting: Internal Medicine

## 2022-08-29 DIAGNOSIS — I10 Essential (primary) hypertension: Secondary | ICD-10-CM

## 2022-08-29 MED ORDER — OLMESARTAN MEDOXOMIL 40 MG PO TABS: 40.0000 mg | ORAL_TABLET | Freq: Every day | ORAL | 0 refills | Status: DC

## 2022-08-29 NOTE — Telephone Encounter (Signed)
Patient is experiencing headaches and high blood pressure since switching her blood pressure meds on Wednesday.  She is presently taking indapamide 1.25 once a day.  She wants to know if she should go back on her previous medication.  Patient's phone #  [phone]

## 2022-08-29 NOTE — Telephone Encounter (Signed)
Pt stated he BP has been running as follows:  12/7  9.30pm 124/85(R)  12/8  12.30pm 141/92(R) 1.50pm 143/87(L)

## 2022-08-29 NOTE — Telephone Encounter (Signed)
Pt has been informed to add Olmesartan in addition to Indapamide. Pt expressed understanding.

## 2022-10-13 DIAGNOSIS — M7542 Impingement syndrome of left shoulder: Secondary | ICD-10-CM | POA: Diagnosis not present

## 2022-10-13 DIAGNOSIS — M7541 Impingement syndrome of right shoulder: Secondary | ICD-10-CM | POA: Diagnosis not present

## 2022-10-13 NOTE — Progress Notes (Signed)
52 y.o. G2P2 Married Caucasian female here for annual exam.    Lost 70 some pounds in the last 1.5 years.  Lost 100 pounds from her heaviest weight.  Last Micronor taken in November after Thanksgiving.  No vaginal bleeding since then.  Some hot flashes, which are manageable and last a minute.  Sleeps with fan beside her bed.  Feels more cold due to her weight loss.   PCP:   Scarlette Calico, MD  Patient's last menstrual period was 02/19/2021 (exact date).           Sexually active: Yes.    The current method of family planning is post menopausal status/BTL.    Exercising: No.     Smoker:  former  Health Maintenance: Pap:  03/22/18 neg: HR HPV neg, 12-01-14 Neg:Neg HR HPV  History of abnormal Pap:  yes, after childbirth years ago per patient. No treatment  MMG:  12/24/20 Breast Density Category B, BI-RADS CATEGORY 1 Neg.  She will schedule.   She will schedule. Colonoscopy:  09/05/21 - due in 10 years. BMD:   n/a  Result  n/a TDaP:  12/01/14 Gardasil:   no HIV: 09/11/14 NR Hep C: 07/01/21 NR Screening Labs: PCP Flu vaccine:  declined.    reports that she quit smoking about 10 years ago. Her smoking use included cigarettes. She has a 8.00 pack-year smoking history. She has never used smokeless tobacco. She reports that she does not currently use alcohol. She reports that she does not use drugs.  Past Medical History:  Diagnosis Date   Anemia    dx 02/2021- no meds at this time (07/15/2021)   Cancer ([place]) 2019   Basal cell left ear   Cervical stenosis (uterine cervix) 03/26/2017   Diabetes mellitus without complication ([place])    on meds   Elevated hemoglobin A1c 03/10/2017   level 7.1   Hyperlipidemia    on meds   Hypertension    on meds    Past Surgical History:  Procedure Laterality Date   APPENDECTOMY  1984   COLONOSCOPY     EXTERNAL EAR SURGERY Left 2019   basal cell removal   TUBAL LIGATION  1997   WISDOM TOOTH EXTRACTION      Current Outpatient Medications   Medication Sig Dispense Refill   aspirin EC 81 MG tablet Take 1 tablet (81 mg total) by mouth daily. Swallow whole. 90 tablet 3   Continuous Blood Gluc Receiver (FREESTYLE LIBRE 2 READER) DEVI 1 Act by Does not apply route daily. 2 each 5   Continuous Blood Gluc Sensor (FREESTYLE LIBRE 2 SENSOR) MISC 1 Act by Does not apply route daily. 2 each 5   empagliflozin (JARDIANCE) 10 MG TABS tablet Take 1 tablet (10 mg total) by mouth daily before breakfast. 30 tablet 0   indapamide (LOZOL) 1.25 MG tablet Take 1 tablet (1.25 mg total) by mouth daily. 90 tablet 0   KLOR-CON M15 15 MEQ TBCR Take 1 tablet (15 mEq total) by mouth 2 (two) times daily. 180 each 1   levocetirizine (XYZAL) 5 MG tablet Take 1 tablet (5 mg total) by mouth every evening. 90 tablet 1   MOUNJARO 10 MG/0.5ML Pen INJECT '10MG'$  SUBCUTANEOUSLY ONCE A WEEK 6 mL 0   OneTouch Delica Lancets 97C MISC See admin instructions.     ONETOUCH VERIO test strip USE 1 STRIP TO CHECK GLUCOSE ONCE DAILY  11   rosuvastatin (CRESTOR) 5 MG tablet Take 1 tablet (5 mg total) by mouth  daily. 90 tablet 1   No current facility-administered medications for this visit.    Family History  Problem Relation Age of Onset   Diabetes Mother    Glaucoma Father    Breast cancer Maternal Grandmother        Late 70's   Diabetes Other        mother's side   Colon polyps Neg Hx    Colon cancer Neg Hx    Esophageal cancer Neg Hx    Stomach cancer Neg Hx    Rectal cancer Neg Hx     Review of Systems  All other systems reviewed and are negative.   Exam:   BP 124/78 (BP Location: Left Arm, Patient Position: Sitting, Cuff Size: Large)   Pulse 68   Ht '5\' 5"'$  (1.651 m)   Wt 206 lb (93.4 kg)   LMP 02/19/2021 (Exact Date)   SpO2 97%   BMI 34.28 kg/m     General appearance: alert, cooperative and appears stated age Head: normocephalic, without obvious abnormality, atraumatic Neck: no adenopathy, supple, symmetrical, trachea midline and thyroid normal to  inspection and palpation Lungs: clear to auscultation bilaterally Breasts: normal appearance, no masses or tenderness, No nipple retraction or dimpling, No nipple discharge or bleeding, No axillary adenopathy Heart: regular rate and rhythm Abdomen: soft, non-tender; no masses, no organomegaly Extremities: extremities normal, atraumatic, no cyanosis or edema Skin: skin color, texture, turgor normal. No rashes or lesions Lymph nodes: cervical, supraclavicular, and axillary nodes normal. Neurologic: grossly normal  Pelvic: External genitalia:  no lesions              No abnormal inguinal nodes palpated.              Urethra:  normal appearing urethra with no masses, tenderness or lesions              Bartholins and Skenes: normal                 Vagina: normal appearing vagina with normal color and discharge, no lesions              Cervix: no lesions              Pap taken: yes Bimanual Exam:  Uterus:  normal size, contour, position, consistency, mobility, non-tender              Adnexa: no mass, fullness, tenderness              Rectal exam: yes.  Confirms.              Anus:  normal sphincter tone, no lesions  Chaperone was present for exam:  Raquel Sarna  Assessment:   Well woman visit with gynecologic exam. Menopausal symptoms.  Off Micronor.  Status post BTL.  Hx cervical stenosis.  Successful weight loss.   Plan: Mammogram screening discussed.  She will schedule at the [facility].  Self breast awareness reviewed. Pap and HR HPV collected. Guidelines for Calcium, Vitamin D, regular exercise program including cardiovascular and weight bearing exercise. No need to restart Micronor which was used to treat menorrhagia and dysmenorrhea.  We discussed treatment options for vasomotor symptoms:  HRT, SSRI/SNRI, [place].  She declines these options.  Follow up annually and prn.   After visit summary provided.

## 2022-10-20 ENCOUNTER — Ambulatory Visit: Payer: BC Managed Care – PPO | Admitting: Obstetrics and Gynecology

## 2022-10-24 ENCOUNTER — Other Ambulatory Visit: Payer: Self-pay

## 2022-10-24 MED ORDER — EMPAGLIFLOZIN 10 MG PO TABS: 10.0000 mg | ORAL_TABLET | Freq: Every day | ORAL | 0 refills | Status: DC

## 2022-10-26 DIAGNOSIS — M7542 Impingement syndrome of left shoulder: Secondary | ICD-10-CM | POA: Insufficient documentation

## 2022-10-26 DIAGNOSIS — M7541 Impingement syndrome of right shoulder: Secondary | ICD-10-CM | POA: Insufficient documentation

## 2022-10-27 ENCOUNTER — Ambulatory Visit (INDEPENDENT_AMBULATORY_CARE_PROVIDER_SITE_OTHER): Payer: BC Managed Care – PPO | Admitting: Obstetrics and Gynecology

## 2022-10-27 ENCOUNTER — Encounter: Payer: Self-pay | Admitting: Internal Medicine

## 2022-10-27 ENCOUNTER — Encounter: Payer: Self-pay | Admitting: Obstetrics and Gynecology

## 2022-10-27 ENCOUNTER — Ambulatory Visit (INDEPENDENT_AMBULATORY_CARE_PROVIDER_SITE_OTHER): Payer: BC Managed Care – PPO | Admitting: Internal Medicine

## 2022-10-27 ENCOUNTER — Other Ambulatory Visit (HOSPITAL_COMMUNITY)
Admission: RE | Admit: 2022-10-27 | Discharge: 2022-10-27 | Disposition: A | Discharge status: Home / Self Care | Payer: BC Managed Care – PPO | Source: Ambulatory Visit | Attending: Obstetrics and Gynecology | Admitting: Obstetrics and Gynecology

## 2022-10-27 VITALS — BP 124/78 | HR 68 | Ht 65.0 in | Wt 206.0 lb

## 2022-10-27 VITALS — BP 120/86 | HR 82 | Temp 98.1°F | Ht 65.0 in | Wt 208.0 lb

## 2022-10-27 DIAGNOSIS — I10 Essential (primary) hypertension: Secondary | ICD-10-CM | POA: Diagnosis not present

## 2022-10-27 DIAGNOSIS — E118 Type 2 diabetes mellitus with unspecified complications: Secondary | ICD-10-CM

## 2022-10-27 DIAGNOSIS — Z124 Encounter for screening for malignant neoplasm of cervix: Secondary | ICD-10-CM | POA: Diagnosis not present

## 2022-10-27 DIAGNOSIS — Z01419 Encounter for gynecological examination (general) (routine) without abnormal findings: Secondary | ICD-10-CM

## 2022-10-27 DIAGNOSIS — E876 Hypokalemia: Secondary | ICD-10-CM

## 2022-10-27 DIAGNOSIS — Z1231 Encounter for screening mammogram for malignant neoplasm of breast: Secondary | ICD-10-CM

## 2022-10-27 DIAGNOSIS — T502X5A Adverse effect of carbonic-anhydrase inhibitors, benzothiadiazides and other diuretics, initial encounter: Secondary | ICD-10-CM | POA: Diagnosis not present

## 2022-10-27 DIAGNOSIS — Z0001 Encounter for general adult medical examination with abnormal findings: Secondary | ICD-10-CM | POA: Diagnosis not present

## 2022-10-27 LAB — CBC WITH DIFFERENTIAL/PLATELET
Basophils Absolute: 0 10*3/uL (ref 0.0–0.1)
Basophils Relative: 0.4 % (ref 0.0–3.0)
Eosinophils Absolute: 0 10*3/uL (ref 0.0–0.7)
Eosinophils Relative: 0.5 % (ref 0.0–5.0)
HCT: 38.5 % (ref 36.0–46.0)
Hemoglobin: 13.5 g/dL (ref 12.0–15.0)
Lymphocytes Relative: 32.9 % (ref 12.0–46.0)
Lymphs Abs: 2.9 10*3/uL (ref 0.7–4.0)
MCHC: 35.2 g/dL (ref 30.0–36.0)
MCV: 87.8 fl (ref 78.0–100.0)
Monocytes Absolute: 0.7 10*3/uL (ref 0.1–1.0)
Monocytes Relative: 8.5 % (ref 3.0–12.0)
Neutro Abs: 5 10*3/uL (ref 1.4–7.7)
Neutrophils Relative %: 57.7 % (ref 43.0–77.0)
Platelets: 234 10*3/uL (ref 150.0–400.0)
RBC: 4.38 Mil/uL (ref 3.87–5.11)
RDW: 13.3 % (ref 11.5–15.5)
WBC: 8.7 10*3/uL (ref 4.0–10.5)

## 2022-10-27 LAB — BASIC METABOLIC PANEL
BUN: 20 mg/dL (ref 6–23)
CO2: 29 mEq/L (ref 19–32)
Calcium: 9.3 mg/dL (ref 8.4–10.5)
Chloride: 101 mEq/L (ref 96–112)
Creatinine, Ser: 0.7 mg/dL (ref 0.40–1.20)
GFR: 98.65 mL/min (ref >60.00)
Glucose, Bld: 82 mg/dL (ref 70–99)
Potassium: 3.5 mEq/L (ref 3.5–5.1)
Sodium: 137 mEq/L (ref 135–145)

## 2022-10-27 LAB — MAGNESIUM: Magnesium: 2.1 mg/dL (ref 1.5–2.5)

## 2022-10-27 MED ORDER — KLOR-CON M15 15 MEQ PO TBCR: 1.0000 | EXTENDED_RELEASE_TABLET | Freq: Two times a day (BID) | ORAL | 1 refills | Status: DC

## 2022-10-27 NOTE — Progress Notes (Signed)
Subjective:  Patient ID: Suzanne Bartlett, female    DOB: [birth year]  Age: 52 y.o. MRN: [id number]  CC: Annual Exam, Hypertension, Hyperlipidemia, and Diabetes   HPI Suzanne Bartlett presents for a CPX and f/up -   She has recently been seen elsewhere are was told that her SBP was in the 110-120 range. She is active and denies DOE, CP, SOB, edema.  Outpatient Medications Prior to Visit  Medication Sig Dispense Refill   aspirin EC 81 MG tablet Take 1 tablet (81 mg total) by mouth daily. Swallow whole. 90 tablet 3   Continuous Blood Gluc Receiver (FREESTYLE LIBRE 2 READER) DEVI 1 Act by Does not apply route daily. 2 each 5   Continuous Blood Gluc Sensor (FREESTYLE LIBRE 2 SENSOR) MISC 1 Act by Does not apply route daily. 2 each 5   empagliflozin (JARDIANCE) 10 MG TABS tablet Take 1 tablet (10 mg total) by mouth daily before breakfast. 30 tablet 0   indapamide (LOZOL) 1.25 MG tablet Take 1 tablet (1.25 mg total) by mouth daily. 90 tablet 0   levocetirizine (XYZAL) 5 MG tablet Take 1 tablet (5 mg total) by mouth every evening. 90 tablet 1   MOUNJARO 10 MG/0.5ML Pen INJECT '10MG'$  SUBCUTANEOUSLY ONCE A WEEK 6 mL 0   OneTouch Delica Lancets 90W MISC See admin instructions.     ONETOUCH VERIO test strip USE 1 STRIP TO CHECK GLUCOSE ONCE DAILY  11   rosuvastatin (CRESTOR) 5 MG tablet Take 1 tablet (5 mg total) by mouth daily. 90 tablet 1   KLOR-CON M15 15 MEQ TBCR Take 1 tablet by mouth 2 (two) times daily. 180 each 0   norethindrone (HEATHER) 0.35 MG tablet TAKE 1 TABLET BY MOUTH DAILY. 84 tablet 3   olmesartan (BENICAR) 40 MG tablet Take 1 tablet (40 mg total) by mouth daily. 90 tablet 0   No facility-administered medications prior to visit.    ROS Review of Systems  Constitutional: Negative.  Negative for diaphoresis, fatigue and unexpected weight change.  HENT: Negative.    Eyes: Negative.   Respiratory:  Negative for cough, chest tightness, shortness of breath and wheezing.    Cardiovascular:  Negative for chest pain, palpitations and leg swelling.  Gastrointestinal:  Negative for abdominal pain, diarrhea, nausea and vomiting.  Endocrine: Negative.   Genitourinary: Negative.  Negative for difficulty urinating and dysuria.  Musculoskeletal: Negative.  Negative for arthralgias and myalgias.  Skin: Negative.   Neurological: Negative.  Negative for dizziness, weakness and light-headedness.  Hematological:  Negative for adenopathy. Does not bruise/bleed easily.  Psychiatric/Behavioral: Negative.      Objective:  BP 120/86 (BP Location: Left Arm, Patient Position: Sitting, Cuff Size: Large)   Pulse 82   Temp 98.1 F (36.7 C) (Oral)   Ht '5\' 5"'$  (1.651 m)   Wt 208 lb (94.3 kg)   LMP 02/19/2021 (Exact Date)   SpO2 98%   BMI 34.61 kg/m   BP Readings from Last 3 Encounters:  10/27/22 124/78  10/27/22 120/86  08/18/22 118/72    Wt Readings from Last 3 Encounters:  10/27/22 206 lb (93.4 kg)  10/27/22 208 lb (94.3 kg)  08/18/22 208 lb (94.3 kg)    Physical Exam Vitals reviewed.  Constitutional:      Appearance: She is not ill-appearing.  HENT:     Nose: Nose normal.     Mouth/Throat:     Mouth: Mucous membranes are moist.  Eyes:     General: No scleral  icterus.    Conjunctiva/sclera: Conjunctivae normal.  Cardiovascular:     Rate and Rhythm: Normal rate and regular rhythm.     Heart sounds: No murmur heard. Pulmonary:     Effort: Pulmonary effort is normal.     Breath sounds: No stridor. No wheezing, rhonchi or rales.  Abdominal:     General: Abdomen is flat.     Palpations: There is no mass.     Tenderness: There is no abdominal tenderness. There is no guarding.     Hernia: No hernia is present.  Musculoskeletal:        General: Normal range of motion.     Cervical back: Neck supple.     Right lower leg: No edema.     Left lower leg: No edema.  Lymphadenopathy:     Cervical: No cervical adenopathy.  Skin:    General: Skin is warm and  dry.     Coloration: Skin is not pale.  Neurological:     General: No focal deficit present.     Mental Status: She is alert. Mental status is at baseline.  Psychiatric:        Mood and Affect: Mood normal.        Behavior: Behavior normal.     Lab Results  Component Value Date   WBC 8.7 10/27/2022   HGB 13.5 10/27/2022   HCT 38.5 10/27/2022   PLT 234.0 10/27/2022   GLUCOSE 82 10/27/2022   CHOL 156 08/18/2022   TRIG 89.0 08/18/2022   HDL 46.40 08/18/2022   LDLCALC 92 08/18/2022   ALT 14 10/17/2021   AST 15 10/17/2021   NA 137 10/27/2022   K 3.5 10/27/2022   CL 101 10/27/2022   CREATININE 0.70 10/27/2022   BUN 20 10/27/2022   CO2 29 10/27/2022   TSH 1.72 10/17/2021   HGBA1C 5.9 08/18/2022   MICROALBUR <0.7 08/18/2022    MM DIAG BREAST TOMO UNI LEFT  Result Date: 12/24/2020 CLINICAL DATA:  Recall from 2D screening mammography, possible focal asymmetry involving the OUTER LEFT breast at POSTERIOR depth. EXAM: DIGITAL DIAGNOSTIC UNILATERAL LEFT MAMMOGRAM WITH TOMOSYNTHESIS TECHNIQUE: Left digital diagnostic mammography and breast tomosynthesis was performed. COMPARISON:  Previous exam(s). ACR Breast Density Category b: There are scattered areas of fibroglandular density. FINDINGS: Spot-compression CC and MLO views area of concern in the OUTER breast were obtained. The focal asymmetry questioned on screening mammography disperses with compression, indicating overlapping fibroglandular tissue. There is no underlying mass or architectural distortion. IMPRESSION: No mammographic evidence of malignancy involving the LEFT breast. RECOMMENDATION: Screening mammogram in one year.(Code:SM-B-01Y) I have discussed the findings and recommendations with the patient. If applicable, a reminder letter will be sent to the patient regarding the next appointment. BI-RADS CATEGORY  1: Negative. Electronically Signed   By: Evangeline Dakin M.D.   On: 12/24/2020 09:44    Assessment & Plan:   [name] was  seen today for annual exam, hypertension, hyperlipidemia and diabetes.  Diagnoses and all orders for this visit:  Primary hypertension- Her BP is over-controlled. Will discontinue the ARB. -     CBC with Differential/Platelet; Future -     Basic metabolic panel; Future -     Magnesium; Future -     Magnesium -     Basic metabolic panel -     CBC with Differential/Platelet -     KLOR-CON M15 15 MEQ TBCR; Take 1 tablet (15 mEq total) by mouth 2 (two) times daily.  Diuretic-induced hypokalemia -  Basic metabolic panel; Future -     Magnesium; Future -     Magnesium -     Basic metabolic panel -     KLOR-CON M15 15 MEQ TBCR; Take 1 tablet (15 mEq total) by mouth 2 (two) times daily.  Type II diabetes mellitus with manifestations ([place])- Her blood sugar is well controlled. -     Basic metabolic panel; Future -     Basic metabolic panel  Visit for screening mammogram -     MM DIGITAL SCREENING BILATERAL; Future  Encounter for general adult medical examination with abnormal findings- Exam completed, labs reviewed, vaccines reviewed and updated, cancer screenings addressed, patient education was given.   I have discontinued Chauncey Cruel. Schaus's olmesartan. I have also changed her Klor-Con M15. Additionally, I am having her maintain her OneTouch Verio, OneTouch Delica Lancets 15B, FreeStyle Libre 2 Sensor, [employer] 2 Reader, levocetirizine, aspirin EC, rosuvastatin, Mounjaro, indapamide, and empagliflozin.  Meds ordered this encounter  Medications   KLOR-CON M15 15 MEQ TBCR    Sig: Take 1 tablet (15 mEq total) by mouth 2 (two) times daily.    Dispense:  180 each    Refill:  1     Follow-up: Return in about 6 months (around 04/27/2023).  Scarlette Calico, MD

## 2022-10-27 NOTE — Patient Instructions (Signed)

## 2022-10-27 NOTE — Patient Instructions (Signed)

## 2022-10-29 LAB — CYTOLOGY - PAP
Comment: NEGATIVE
Diagnosis: NEGATIVE
High risk HPV: NEGATIVE

## 2022-11-05 LAB — HM DIABETES EYE EXAM

## 2022-11-13 ENCOUNTER — Other Ambulatory Visit: Payer: Self-pay | Admitting: Internal Medicine

## 2022-11-13 DIAGNOSIS — E118 Type 2 diabetes mellitus with unspecified complications: Secondary | ICD-10-CM

## 2022-11-18 ENCOUNTER — Other Ambulatory Visit: Payer: Self-pay | Admitting: Internal Medicine

## 2022-11-18 DIAGNOSIS — I10 Essential (primary) hypertension: Secondary | ICD-10-CM

## 2022-11-25 DIAGNOSIS — M545 Low back pain, unspecified: Secondary | ICD-10-CM | POA: Diagnosis not present

## 2022-11-25 DIAGNOSIS — S39012A Strain of muscle, fascia and tendon of lower back, initial encounter: Secondary | ICD-10-CM | POA: Diagnosis not present

## 2022-11-25 DIAGNOSIS — M25511 Pain in right shoulder: Secondary | ICD-10-CM | POA: Diagnosis not present

## 2022-11-25 DIAGNOSIS — M25512 Pain in left shoulder: Secondary | ICD-10-CM | POA: Diagnosis not present

## 2022-12-02 NOTE — Progress Notes (Unsigned)
Office Visit    Patient Name: Suzanne Bartlett Date of Encounter: 12/03/2022  PCP:  Janith Lima, MD   [facility]  Cardiologist:  Early Osmond, MD  Advanced Practice Provider:  No care team member to display Electrophysiologist:  None   HPI    Suzanne Bartlett is a 52 y.o. female with a past medical history of diabetes mellitus, basal cell cancer, anemia, hypertension, hyperlipidemia, syncope and collapse presents today for follow-up appointment.  Patient was last seen 11/08/2021 and at that time was being worked up for syncope.  This was thought to be a vasovagal incident.  An echocardiogram and a 3-day monitor was obtained.  Jardiance was initiated due to diabetes and heart disease.  LDL goal less than 70 given her history of diabetes.  Echocardiogram showed some mild left ventricular hypertrophy but no clear cause for her syncopal event.  Monitor showed no rhythm issues that would have caused her symptoms.  Today, she tells me that she has had 2 episodes since she was last seen in the office.  Usually they occur in the middle of the night when she gets up to use the restroom. Never reached the point of passing out, she gets the shakes and immediate sweats.  She did have a neuro workup, head scan and made sure it was nothing neuro. She has lost some weight. She was on lisinopril before for her BP. She was at 320 lbs at one time. Friday terrible headaches and found out her BP was elevated 0000000 systolic.. Olmesartan was started feeling water retention. She was on indapamide and olmesartan together-end of January. Took off of olmesartan due to her BP getting too low. She still has some LE edema at times.  Reports no shortness of breath nor dyspnea on exertion. Reports no chest pain, pressure, or tightness. No orthopnea, PND. Reports no palpitations.   Past Medical History    Past Medical History:  Diagnosis Date   Anemia    dx 02/2021- no meds at  this time (07/15/2021)   Cancer ([place]) 2019   Basal cell left ear   Cervical stenosis (uterine cervix) 03/26/2017   Diabetes mellitus without complication ([place])    on meds   Elevated hemoglobin A1c 03/10/2017   level 7.1   Hyperlipidemia    on meds   Hypertension    on meds   Past Surgical History:  Procedure Laterality Date   APPENDECTOMY  1984   COLONOSCOPY     EXTERNAL EAR SURGERY Left 2019   basal cell removal   TUBAL LIGATION  1997   WISDOM TOOTH EXTRACTION      Allergies  No Known Allergies  EKGs/Labs/Other Studies Reviewed:   The following studies were reviewed today:  Echo 11/21/2021  IMPRESSIONS     1. Left ventricular ejection fraction, by estimation, is 60 to 65%. The  left ventricle has normal function. The left ventricle has no regional  wall motion abnormalities. There is mild left ventricular hypertrophy.  Left ventricular diastolic parameters  are consistent with Grade I diastolic dysfunction (impaired relaxation).   2. Right ventricular systolic function is normal. The right ventricular  size is normal. There is normal pulmonary artery systolic pressure. The  estimated right ventricular systolic pressure is XX123456 mmHg.   3. The mitral valve is normal in structure. No evidence of mitral valve  regurgitation. No evidence of mitral stenosis.   4. The aortic valve is tricuspid. Aortic valve regurgitation is  not  visualized. No aortic stenosis is present.   5. The inferior vena cava is normal in size with greater than 50%  respiratory variability, suggesting right atrial pressure of 3 mmHg.   FINDINGS   Left Ventricle: Left ventricular ejection fraction, by estimation, is 60  to 65%. The left ventricle has normal function. The left ventricle has no  regional wall motion abnormalities. The left ventricular internal cavity  size was normal in size. There is   mild left ventricular hypertrophy. Left ventricular diastolic parameters  are consistent with  Grade I diastolic dysfunction (impaired relaxation).   Right Ventricle: The right ventricular size is normal. No increase in  right ventricular wall thickness. Right ventricular systolic function is  normal. There is normal pulmonary artery systolic pressure. The tricuspid  regurgitant velocity is 2.15 m/s, and   with an assumed right atrial pressure of 3 mmHg, the estimated right  ventricular systolic pressure is [id number] mmHg.   Left Atrium: Left atrial size was normal in size.   Right Atrium: Right atrial size was normal in size.   Pericardium: There is no evidence of pericardial effusion.   Mitral Valve: The mitral valve is normal in structure. No evidence of  mitral valve regurgitation. No evidence of mitral valve stenosis.   Tricuspid Valve: The tricuspid valve is normal in structure. Tricuspid  valve regurgitation is trivial.   Aortic Valve: The aortic valve is tricuspid. Aortic valve regurgitation is  not visualized. No aortic stenosis is present.   Pulmonic Valve: The pulmonic valve was normal in structure. Pulmonic valve  regurgitation is not visualized.   Aorta: The aortic root is normal in size and structure.   Venous: The inferior vena cava is normal in size with greater than 50%  respiratory variability, suggesting right atrial pressure of 3 mmHg.   IAS/Shunts: No atrial level shunt detected by color flow Doppler   ZIO monitor 11/26/2021  Patch Wear Time:  2 days and 22 hours (2023-02-22T16:28:59-0500 to 2023-02-25T15:09:16-0500)   Patient had a min HR of 54 bpm, max HR of 151 bpm, and avg HR of 88 bpm. Predominant underlying rhythm was Sinus Rhythm.   EVENTS: Isolated SVEs were rare (<1.0%), SVE Couplets were rare (<1.0%), and no SVE Triplets were present.    Isolated VEs were rare (<1.0%), and no VE Couplets or VE Triplets were present.    Patient triggered events corresponded with sinus rhythm.   No atrial fibrillation, ventricular tachyarrhythmias, or  bradyarrhythmias were detected.  EKG:  EKG is  ordered today.  The ekg ordered today demonstrates NSR rate 73 bpm  Recent Labs: 10/27/2022: BUN 20; Creatinine, Ser 0.70; Hemoglobin 13.5; Magnesium 2.1; Platelets 234.0; Potassium 3.5; Sodium 137  Recent Lipid Panel    Component Value Date/Time   CHOL 156 08/18/2022 0851   TRIG 89.0 08/18/2022 0851   HDL 46.40 08/18/2022 0851   CHOLHDL 3 08/18/2022 0851   VLDL 17.8 08/18/2022 0851   LDLCALC 92 08/18/2022 0851    Home Medications   Current Meds  Medication Sig   aspirin EC 81 MG tablet Take 1 tablet (81 mg total) by mouth daily. Swallow whole.   Continuous Blood Gluc Receiver (FREESTYLE LIBRE 2 READER) DEVI 1 Act by Does not apply route daily.   Continuous Blood Gluc Sensor (FREESTYLE LIBRE 2 SENSOR) MISC 1 Act by Does not apply route daily.   diclofenac (VOLTAREN) 75 MG EC tablet Take 75 mg by mouth at bedtime.   hydrochlorothiazide (MICROZIDE) 12.5 MG capsule  Take 1 capsule (12.5 mg total) by mouth daily.   KLOR-CON M15 15 MEQ TBCR Take 1 tablet (15 mEq total) by mouth 2 (two) times daily.   levocetirizine (XYZAL) 5 MG tablet Take 1 tablet (5 mg total) by mouth every evening.   losartan (COZAAR) 25 MG tablet Take 1 tablet (25 mg total) by mouth daily.   MOUNJARO 10 MG/0.5ML Pen INJECT 10 MG IN THE SKIN ONCE A WEEK   OneTouch Delica Lancets [id number] MISC See admin instructions.   ONETOUCH VERIO test strip USE 1 STRIP TO CHECK GLUCOSE ONCE DAILY   rosuvastatin (CRESTOR) 5 MG tablet Take 1 tablet (5 mg total) by mouth daily.   [DISCONTINUED] empagliflozin (JARDIANCE) 10 MG TABS tablet Take 1 tablet (10 mg total) by mouth daily before breakfast.   [DISCONTINUED] indapamide (LOZOL) 1.25 MG tablet Take 1 tablet by mouth once daily     Review of Systems      All other systems reviewed and are otherwise negative except as noted above.  Physical Exam    VS:  BP 130/88   Pulse 73   Ht '5\' 5"'$  (1.651 m)   Wt 209 lb 9.6 oz (95.1 kg)   LMP  02/19/2021 (Exact Date)   SpO2 99%   BMI 34.88 kg/m  , BMI Body mass index is 34.88 kg/m.  Wt Readings from Last 3 Encounters:  12/03/22 209 lb 9.6 oz (95.1 kg)  10/27/22 206 lb (93.4 kg)  10/27/22 208 lb (94.3 kg)     GEN: Well nourished, well developed, in no acute distress. HEENT: normal. Neck: Supple, no JVD, carotid bruits, or masses. Cardiac: RRR, no murmurs, rubs, or gallops. No clubbing, cyanosis, edema.  Radials/PT 2+ and equal bilaterally.  Respiratory:  Respirations regular and unlabored, clear to auscultation bilaterally. GI: Soft, nontender, nondistended. MS: No deformity or atrophy. Skin: Warm and dry, no rash. Neuro:  Strength and sensation are intact. Psych: Normal affect.  Assessment & Plan    Syncope and collapse -full cardiac workup was negative -Neuro workup done through PCP was neg -no further syncope episodes  Hypertension -she was on lisinopril at one time, switched to olmesartan and HCTZ, BP dropped too low. Now on lozol alone and getting headaches, BP elevated -start losartan 25 and HCTZ 12.5 with possible progression to combo pill, stop lozol -please track your BP for me for 2 weeks and send me those values  Hyperlipidemia -LDL 92, at goal -continue crestor  BMI 40-44.9 -encouraged daily activity and discussed med diet -low sodium diet      Disposition: Follow up 1 year with Early Osmond, MD or APP.  Signed, Elgie Collard, PA-C 12/03/2022, 10:19 AM [place]

## 2022-12-03 ENCOUNTER — Ambulatory Visit: Payer: BC Managed Care – PPO | Attending: Physician Assistant | Admitting: Physician Assistant

## 2022-12-03 ENCOUNTER — Encounter: Payer: Self-pay | Admitting: Physician Assistant

## 2022-12-03 VITALS — BP 130/88 | HR 73 | Ht 65.0 in | Wt 209.6 lb

## 2022-12-03 DIAGNOSIS — E118 Type 2 diabetes mellitus with unspecified complications: Secondary | ICD-10-CM | POA: Diagnosis not present

## 2022-12-03 DIAGNOSIS — R55 Syncope and collapse: Secondary | ICD-10-CM | POA: Diagnosis not present

## 2022-12-03 DIAGNOSIS — E785 Hyperlipidemia, unspecified: Secondary | ICD-10-CM

## 2022-12-03 DIAGNOSIS — Z6841 Body Mass Index (BMI) 40.0 and over, adult: Secondary | ICD-10-CM | POA: Diagnosis not present

## 2022-12-03 DIAGNOSIS — I10 Essential (primary) hypertension: Secondary | ICD-10-CM | POA: Diagnosis not present

## 2022-12-03 MED ORDER — HYDROCHLOROTHIAZIDE 12.5 MG PO CAPS: 12.5000 mg | ORAL_CAPSULE | Freq: Every day | ORAL | 3 refills | Status: DC

## 2022-12-03 MED ORDER — EMPAGLIFLOZIN 10 MG PO TABS: 10.0000 mg | ORAL_TABLET | Freq: Every day | ORAL | 3 refills | Status: DC

## 2022-12-03 MED ORDER — LOSARTAN POTASSIUM 25 MG PO TABS: 25.0000 mg | ORAL_TABLET | Freq: Every day | ORAL | 3 refills | Status: DC

## 2022-12-03 NOTE — Patient Instructions (Signed)
Medication Instructions:  1.Stop indapamide (Lozol) 2.Start losartan (Cozaar) 25 mg daily 3.Start hydrochlorothiazide (Microzide) 12.5 mg daily *If you need a refill on your cardiac medications before your next appointment, please call your pharmacy*  Lab Work: BMET in one month If you have labs (blood work) drawn today and your tests are completely normal, you will receive your results only by: [place] (if you have MyChart) OR A paper copy in the mail If you have any lab test that is abnormal or we need to change your treatment, we will call you to review the results.  Follow-Up: At [facility], you and your health needs are our priority.  As part of our continuing mission to provide you with exceptional heart care, we have created designated Provider Care Teams.  These Care Teams include your primary Cardiologist (physician) and Advanced Practice Providers (APPs -  Physician Assistants and Nurse Practitioners) who all work together to provide you with the care you need, when you need it.  Your next appointment:   1 year(s)  Provider:   Early Osmond, MD    Check your blood pressure daily, one hour after taking your morning medications for 2 weeks, keep a log and send [place] the readings at the end of 2 weeks.  Low-Sodium Eating Plan Sodium, which is an element that makes up salt, helps you maintain a healthy balance of fluids in your body. Too much sodium can increase your blood pressure and cause fluid and waste to be held in your body. Your health care provider or dietitian may recommend following this plan if you have high blood pressure (hypertension), kidney disease, liver disease, or heart failure. Eating less sodium can help lower your blood pressure, reduce swelling, and protect your heart, liver, and kidneys. What are tips for following this plan? Reading food labels The Nutrition Facts label lists the amount of sodium in one serving of the food. If you eat  more than one serving, you must multiply the listed amount of sodium by the number of servings. Choose foods with less than 140 mg of sodium per serving. Avoid foods with 300 mg of sodium or more per serving. Shopping  Look for lower-sodium products, often labeled as "low-sodium" or "no salt added." Always check the sodium content, even if foods are labeled as "unsalted" or "no salt added." Buy fresh foods. Avoid canned foods and pre-made or frozen meals. Avoid canned, cured, or processed meats. Buy breads that have less than 80 mg of sodium per slice. Cooking  Eat more home-cooked food and less restaurant, buffet, and fast food. Avoid adding salt when cooking. Use salt-free seasonings or herbs instead of table salt or sea salt. Check with your health care provider or pharmacist before using salt substitutes. Cook with plant-based oils, such as canola, sunflower, or olive oil. Meal planning When eating at a restaurant, ask that your food be prepared with less salt or no salt, if possible. Avoid dishes labeled as brined, pickled, cured, smoked, or made with soy sauce, miso, or teriyaki sauce. Avoid foods that contain MSG (monosodium glutamate). MSG is sometimes added to [place] food, bouillon, and some canned foods. Make meals that can be grilled, baked, poached, roasted, or steamed. These are generally made with less sodium. General information Most people on this plan should limit their sodium intake to 1,500-2,000 mg (milligrams) of sodium each day. What foods should I eat? Fruits Fresh, frozen, or canned fruit. Fruit juice. Vegetables Fresh or frozen vegetables. "No  salt added" canned vegetables. "No salt added" tomato sauce and paste. Low-sodium or reduced-sodium tomato and vegetable juice. Grains Low-sodium cereals, including oats, puffed wheat and rice, and shredded wheat. Low-sodium crackers. Unsalted rice. Unsalted pasta. Low-sodium bread. Whole-grain breads and whole-grain  pasta. Meats and other proteins Fresh or frozen (no salt added) meat, poultry, seafood, and fish. Low-sodium canned tuna and salmon. Unsalted nuts. Dried peas, beans, and lentils without added salt. Unsalted canned beans. Eggs. Unsalted nut butters. Dairy Milk. Soy milk. Cheese that is naturally low in sodium, such as ricotta cheese, fresh mozzarella, or Swiss cheese. Low-sodium or reduced-sodium cheese. Cream cheese. Yogurt. Seasonings and condiments Fresh and dried herbs and spices. Salt-free seasonings. Low-sodium mustard and ketchup. Sodium-free salad dressing. Sodium-free light mayonnaise. Fresh or refrigerated horseradish. Lemon juice. Vinegar. Other foods Homemade, reduced-sodium, or low-sodium soups. Unsalted popcorn and pretzels. Low-salt or salt-free chips. The items listed above may not be a complete list of foods and beverages you can eat. Contact a dietitian for more information. What foods should I avoid? Vegetables Sauerkraut, pickled vegetables, and relishes. Olives. [place] fries. Onion rings. Regular canned vegetables (not low-sodium or reduced-sodium). Regular canned tomato sauce and paste (not low-sodium or reduced-sodium). Regular tomato and vegetable juice (not low-sodium or reduced-sodium). Frozen vegetables in sauces. Grains Instant hot cereals. Bread stuffing, pancake, and biscuit mixes. Croutons. Seasoned rice or pasta mixes. Noodle soup cups. Boxed or frozen macaroni and cheese. Regular salted crackers. Self-rising flour. Meats and other proteins Meat or fish that is salted, canned, smoked, spiced, or pickled. Precooked or cured meat, such as sausages or meat loaves. [place] Salines. Ham. Pepperoni. Hot dogs. Corned beef. Chipped beef. Salt pork. Jerky. Pickled herring. Anchovies and sardines. Regular canned tuna. Salted nuts. Dairy Processed cheese and cheese spreads. Hard cheeses. Cheese curds. Blue cheese. Feta cheese. String cheese. Regular cottage cheese. Buttermilk. Canned  milk. Fats and oils Salted butter. Regular margarine. Ghee. Bacon fat. Seasonings and condiments Onion salt, garlic salt, seasoned salt, table salt, and sea salt. Canned and packaged gravies. Worcestershire sauce. Tartar sauce. Barbecue sauce. Teriyaki sauce. Soy sauce, including reduced-sodium. Steak sauce. Fish sauce. Oyster sauce. Cocktail sauce. Horseradish that you find on the shelf. Regular ketchup and mustard. Meat flavorings and tenderizers. Bouillon cubes. Hot sauce. Pre-made or packaged marinades. Pre-made or packaged taco seasonings. Relishes. Regular salad dressings. Salsa. Other foods Salted popcorn and pretzels. Corn chips and puffs. Potato and tortilla chips. Canned or dried soups. Pizza. Frozen entrees and pot pies. The items listed above may not be a complete list of foods and beverages you should avoid. Contact a dietitian for more information. Summary Eating less sodium can help lower your blood pressure, reduce swelling, and protect your heart, liver, and kidneys. Most people on this plan should limit their sodium intake to 1,500-2,000 mg (milligrams) of sodium each day. Canned, boxed, and frozen foods are high in sodium. Restaurant foods, fast foods, and pizza are also very high in sodium. You also get sodium by adding salt to food. Try to cook at home, eat more fresh fruits and vegetables, and eat less fast food and canned, processed, or prepared foods. This information is not intended to replace advice given to you by your health care provider. Make sure you discuss any questions you have with your health care provider. Document Revised: 08/15/2019 Document Reviewed: 08/10/2019 Elsevier Patient Education  [place] Many factors influence your heart health, including eating and exercise habits. Heart health is also called  coronary health. Coronary risk increases with abnormal blood fat (lipid) levels. A heart-healthy eating plan includes  limiting unhealthy fats, increasing healthy fats, limiting salt (sodium) intake, and making other diet and lifestyle changes. What is my plan? Your health care provider may recommend that: You limit your fat intake to _________% or less of your total calories each day. You limit your saturated fat intake to _________% or less of your total calories each day. You limit the amount of cholesterol in your diet to less than _________ mg per day. You limit the amount of sodium in your diet to less than _________ mg per day. What are tips for following this plan? Cooking Cook foods using methods other than frying. Baking, boiling, grilling, and broiling are all good options. Other ways to reduce fat include: Removing the skin from poultry. Removing all visible fats from meats. Steaming vegetables in water or broth. Meal planning  At meals, imagine dividing your plate into fourths: Fill one-half of your plate with vegetables and green salads. Fill one-fourth of your plate with whole grains. Fill one-fourth of your plate with lean protein foods. Eat 2-4 cups of vegetables per day. One cup of vegetables equals 1 cup (91 g) broccoli or cauliflower florets, 2 medium carrots, 1 large bell pepper, 1 large sweet potato, 1 large tomato, 1 medium white potato, 2 cups (150 g) raw leafy greens. Eat 1-2 cups of fruit per day. One cup of fruit equals 1 small apple, 1 large banana, 1 cup (237 g) mixed fruit, 1 large orange,  cup (82 g) dried fruit, 1 cup (240 mL) 100% fruit juice. Eat more foods that contain soluble fiber. Examples include apples, broccoli, carrots, beans, peas, and barley. Aim to get 25-30 g of fiber per day. Increase your consumption of legumes, nuts, and seeds to 4-5 servings per week. One serving of dried beans or legumes equals  cup (90 g) cooked, 1 serving of nuts is  oz (12 almonds, 24 pistachios, or 7 walnut halves), and 1 serving of seeds equals  oz (8 g). Fats Choose healthy fats  more often. Choose monounsaturated and polyunsaturated fats, such as olive and canola oils, avocado oil, flaxseeds, walnuts, almonds, and seeds. Eat more omega-3 fats. Choose salmon, mackerel, sardines, tuna, flaxseed oil, and ground flaxseeds. Aim to eat fish at least 2 times each week. Check food labels carefully to identify foods with trans fats or high amounts of saturated fat. Limit saturated fats. These are found in animal products, such as meats, butter, and cream. Plant sources of saturated fats include palm oil, palm kernel oil, and coconut oil. Avoid foods with partially hydrogenated oils in them. These contain trans fats. Examples are stick margarine, some tub margarines, cookies, crackers, and other baked goods. Avoid fried foods. General information Eat more home-cooked food and less restaurant, buffet, and fast food. Limit or avoid alcohol. Limit foods that are high in added sugar and simple starches such as foods made using white refined flour (white breads, pastries, sweets). Lose weight if you are overweight. Losing just 5-10% of your body weight can help your overall health and prevent diseases such as diabetes and heart disease. Monitor your sodium intake, especially if you have high blood pressure. Talk with your health care provider about your sodium intake. Try to incorporate more vegetarian meals weekly. What foods should I eat? Fruits All fresh, canned (in natural juice), or frozen fruits. Vegetables Fresh or frozen vegetables (raw, steamed, roasted, or grilled). Green salads. Grains Most  grains. Choose whole wheat and whole grains most of the time. Rice and pasta, including brown rice and pastas made with whole wheat. Meats and other proteins Lean, well-trimmed beef, veal, pork, and lamb. Chicken and [place] without skin. All fish and shellfish. Wild duck, rabbit, pheasant, and venison. Egg whites or low-cholesterol egg substitutes. Dried beans, peas, lentils, and tofu.  Seeds and most nuts. Dairy Low-fat or nonfat cheeses, including ricotta and mozzarella. Skim or 1% milk (liquid, powdered, or evaporated). Buttermilk made with low-fat milk. Nonfat or low-fat yogurt. Fats and oils Non-hydrogenated (trans-free) margarines. Vegetable oils, including soybean, sesame, sunflower, olive, avocado, peanut, safflower, corn, canola, and cottonseed. Salad dressings or mayonnaise made with a vegetable oil. Beverages Water (mineral or sparkling). Coffee and tea. Unsweetened ice tea. Diet beverages. Sweets and desserts Sherbet, gelatin, and fruit ice. Small amounts of dark chocolate. Limit all sweets and desserts. Seasonings and condiments All seasonings and condiments. The items listed above may not be a complete list of foods and beverages you can eat. Contact a dietitian for more options. What foods should I avoid? Fruits Canned fruit in heavy syrup. Fruit in cream or butter sauce. Fried fruit. Limit coconut. Vegetables Vegetables cooked in cheese, cream, or butter sauce. Fried vegetables. Grains Breads made with saturated or trans fats, oils, or whole milk. Croissants. Sweet rolls. Donuts. High-fat crackers, such as cheese crackers and chips. Meats and other proteins Fatty meats, such as hot dogs, ribs, sausage, bacon, rib-eye roast or steak. High-fat deli meats, such as salami and bologna. Caviar. Domestic duck and goose. Organ meats, such as liver. Dairy Cream, sour cream, cream cheese, and creamed cottage cheese. Whole-milk cheeses. Whole or 2% milk (liquid, evaporated, or condensed). Whole buttermilk. Cream sauce or high-fat cheese sauce. Whole-milk yogurt. Fats and oils Meat fat, or shortening. Cocoa butter, hydrogenated oils, palm oil, coconut oil, palm kernel oil. Solid fats and shortenings, including bacon fat, salt pork, lard, and butter. Nondairy cream substitutes. Salad dressings with cheese or sour cream. Beverages Regular sodas and any drinks with added  sugar. Sweets and desserts Frosting. Pudding. Cookies. Cakes. Pies. Milk chocolate or white chocolate. Buttered syrups. Full-fat ice cream or ice cream drinks. The items listed above may not be a complete list of foods and beverages to avoid. Contact a dietitian for more information. Summary Heart-healthy meal planning includes limiting unhealthy fats, increasing healthy fats, limiting salt (sodium) intake and making other diet and lifestyle changes. Lose weight if you are overweight. Losing just 5-10% of your body weight can help your overall health and prevent diseases such as diabetes and heart disease. Focus on eating a balance of foods, including fruits and vegetables, low-fat or nonfat dairy, lean protein, nuts and legumes, whole grains, and heart-healthy oils and fats. This information is not intended to replace advice given to you by your health care provider. Make sure you discuss any questions you have with your health care provider. Document Revised: 10/14/2021 Document Reviewed: 10/14/2021 Elsevier Patient Education  [place].

## 2022-12-09 DIAGNOSIS — S39012A Strain of muscle, fascia and tendon of lower back, initial encounter: Secondary | ICD-10-CM | POA: Insufficient documentation

## 2022-12-09 DIAGNOSIS — M25511 Pain in right shoulder: Secondary | ICD-10-CM | POA: Insufficient documentation

## 2022-12-10 ENCOUNTER — Telehealth: Payer: Self-pay | Admitting: Internal Medicine

## 2022-12-10 NOTE — Telephone Encounter (Signed)
Patient is unable to get a new dose of MOUNJARO 10 MG/0.5ML Pen . Her pharmacy only has 5 mg dosage. She would like to know if her prescription could be changed to get that. She said she's missed two doses already. Best callback is [phone].

## 2022-12-11 ENCOUNTER — Other Ambulatory Visit: Payer: Self-pay | Admitting: Internal Medicine

## 2022-12-11 DIAGNOSIS — E118 Type 2 diabetes mellitus with unspecified complications: Secondary | ICD-10-CM

## 2022-12-11 MED ORDER — TIRZEPATIDE 5 MG/0.5ML ~~LOC~~ SOAJ: 5.0000 mg | SUBCUTANEOUS | 0 refills | Status: DC

## 2022-12-31 ENCOUNTER — Ambulatory Visit
Admission: RE | Admit: 2022-12-31 | Discharge: 2022-12-31 | Disposition: A | Discharge status: Home / Self Care | Payer: BC Managed Care – PPO | Source: Ambulatory Visit | Attending: Internal Medicine | Admitting: Internal Medicine

## 2022-12-31 ENCOUNTER — Ambulatory Visit: Payer: BC Managed Care – PPO | Attending: Physician Assistant

## 2022-12-31 DIAGNOSIS — Z6841 Body Mass Index (BMI) 40.0 and over, adult: Secondary | ICD-10-CM | POA: Diagnosis not present

## 2022-12-31 DIAGNOSIS — R55 Syncope and collapse: Secondary | ICD-10-CM | POA: Diagnosis not present

## 2022-12-31 DIAGNOSIS — I10 Essential (primary) hypertension: Secondary | ICD-10-CM

## 2022-12-31 DIAGNOSIS — E785 Hyperlipidemia, unspecified: Secondary | ICD-10-CM

## 2022-12-31 DIAGNOSIS — E118 Type 2 diabetes mellitus with unspecified complications: Secondary | ICD-10-CM | POA: Diagnosis not present

## 2022-12-31 DIAGNOSIS — Z1231 Encounter for screening mammogram for malignant neoplasm of breast: Secondary | ICD-10-CM | POA: Diagnosis not present

## 2023-01-01 LAB — BASIC METABOLIC PANEL
BUN/Creatinine Ratio: 24 — ABNORMAL HIGH (ref 9–23)
BUN: 17 mg/dL (ref 6–24)
CO2: 27 mmol/L (ref 20–29)
Calcium: 9.5 mg/dL (ref 8.7–10.2)
Chloride: 102 mmol/L (ref 96–106)
Creatinine, Ser: 0.71 mg/dL (ref 0.57–1.00)
Glucose: 82 mg/dL (ref 70–99)
Potassium: 3.8 mmol/L (ref 3.5–5.2)
Sodium: 143 mmol/L (ref 134–144)
eGFR: 102 mL/min/{1.73_m2} (ref >59)

## 2023-01-02 ENCOUNTER — Ambulatory Visit: Payer: BC Managed Care – PPO

## 2023-01-23 ENCOUNTER — Encounter: Payer: Self-pay | Admitting: Internal Medicine

## 2023-01-28 ENCOUNTER — Other Ambulatory Visit: Payer: Self-pay | Admitting: Internal Medicine

## 2023-01-28 DIAGNOSIS — E118 Type 2 diabetes mellitus with unspecified complications: Secondary | ICD-10-CM

## 2023-01-28 MED ORDER — TIRZEPATIDE 7.5 MG/0.5ML ~~LOC~~ SOAJ: 7.5000 mg | SUBCUTANEOUS | 0 refills | Status: DC

## 2023-02-12 ENCOUNTER — Other Ambulatory Visit: Payer: Self-pay | Admitting: Internal Medicine

## 2023-02-12 DIAGNOSIS — E785 Hyperlipidemia, unspecified: Secondary | ICD-10-CM

## 2023-03-01 ENCOUNTER — Other Ambulatory Visit: Payer: Self-pay | Admitting: Internal Medicine

## 2023-03-01 DIAGNOSIS — E118 Type 2 diabetes mellitus with unspecified complications: Secondary | ICD-10-CM

## 2023-03-19 DIAGNOSIS — L918 Other hypertrophic disorders of the skin: Secondary | ICD-10-CM | POA: Diagnosis not present

## 2023-03-19 DIAGNOSIS — L820 Inflamed seborrheic keratosis: Secondary | ICD-10-CM | POA: Diagnosis not present

## 2023-03-19 DIAGNOSIS — D225 Melanocytic nevi of trunk: Secondary | ICD-10-CM | POA: Diagnosis not present

## 2023-03-19 DIAGNOSIS — Z789 Other specified health status: Secondary | ICD-10-CM | POA: Diagnosis not present

## 2023-03-19 DIAGNOSIS — L538 Other specified erythematous conditions: Secondary | ICD-10-CM | POA: Diagnosis not present

## 2023-04-10 ENCOUNTER — Other Ambulatory Visit: Payer: Self-pay | Admitting: Internal Medicine

## 2023-04-10 DIAGNOSIS — E118 Type 2 diabetes mellitus with unspecified complications: Secondary | ICD-10-CM

## 2023-04-11 ENCOUNTER — Other Ambulatory Visit: Payer: Self-pay | Admitting: Internal Medicine

## 2023-04-11 DIAGNOSIS — E118 Type 2 diabetes mellitus with unspecified complications: Secondary | ICD-10-CM

## 2023-05-03 ENCOUNTER — Other Ambulatory Visit: Payer: Self-pay | Admitting: Internal Medicine

## 2023-05-03 DIAGNOSIS — E118 Type 2 diabetes mellitus with unspecified complications: Secondary | ICD-10-CM

## 2023-05-12 ENCOUNTER — Other Ambulatory Visit: Payer: Self-pay | Admitting: Internal Medicine

## 2023-05-12 DIAGNOSIS — E785 Hyperlipidemia, unspecified: Secondary | ICD-10-CM

## 2023-05-26 ENCOUNTER — Other Ambulatory Visit: Payer: Self-pay | Admitting: Internal Medicine

## 2023-05-26 DIAGNOSIS — I10 Essential (primary) hypertension: Secondary | ICD-10-CM

## 2023-05-26 DIAGNOSIS — E876 Hypokalemia: Secondary | ICD-10-CM

## 2023-06-09 ENCOUNTER — Other Ambulatory Visit: Payer: Self-pay | Admitting: Internal Medicine

## 2023-06-09 DIAGNOSIS — E785 Hyperlipidemia, unspecified: Secondary | ICD-10-CM

## 2023-06-16 ENCOUNTER — Other Ambulatory Visit: Payer: Self-pay | Admitting: Internal Medicine

## 2023-06-16 DIAGNOSIS — E785 Hyperlipidemia, unspecified: Secondary | ICD-10-CM

## 2023-06-16 DIAGNOSIS — E118 Type 2 diabetes mellitus with unspecified complications: Secondary | ICD-10-CM

## 2023-07-29 ENCOUNTER — Encounter: Payer: Self-pay | Admitting: Internal Medicine

## 2023-07-29 ENCOUNTER — Ambulatory Visit: Payer: BC Managed Care – PPO | Admitting: Internal Medicine

## 2023-07-29 VITALS — BP 142/88 | HR 81 | Temp 97.9°F | Resp 16 | Ht 65.0 in | Wt 212.4 lb

## 2023-07-29 DIAGNOSIS — I10 Essential (primary) hypertension: Secondary | ICD-10-CM | POA: Diagnosis not present

## 2023-07-29 DIAGNOSIS — E785 Hyperlipidemia, unspecified: Secondary | ICD-10-CM

## 2023-07-29 DIAGNOSIS — E118 Type 2 diabetes mellitus with unspecified complications: Secondary | ICD-10-CM | POA: Diagnosis not present

## 2023-07-29 LAB — LIPID PANEL
Cholesterol: 118 mg/dL (ref 0–200)
HDL: 51.1 mg/dL (ref >39.00)
LDL Cholesterol: 52 mg/dL (ref 0–99)
NonHDL: 67.28
Total CHOL/HDL Ratio: 2
Triglycerides: 77 mg/dL (ref 0.0–149.0)
VLDL: 15.4 mg/dL (ref 0.0–40.0)

## 2023-07-29 LAB — CBC WITH DIFFERENTIAL/PLATELET
Basophils Absolute: 0 10*3/uL (ref 0.0–0.1)
Basophils Relative: 0.5 % (ref 0.0–3.0)
Eosinophils Absolute: 0.1 10*3/uL (ref 0.0–0.7)
Eosinophils Relative: 1.5 % (ref 0.0–5.0)
HCT: 43.7 % (ref 36.0–46.0)
Hemoglobin: 14.8 g/dL (ref 12.0–15.0)
Lymphocytes Relative: 41.7 % (ref 12.0–46.0)
Lymphs Abs: 2.3 10*3/uL (ref 0.7–4.0)
MCHC: 33.8 g/dL (ref 30.0–36.0)
MCV: 87.8 fL (ref 78.0–100.0)
Monocytes Absolute: 0.4 10*3/uL (ref 0.1–1.0)
Monocytes Relative: 7.9 % (ref 3.0–12.0)
Neutro Abs: 2.7 10*3/uL (ref 1.4–7.7)
Neutrophils Relative %: 48.4 % (ref 43.0–77.0)
Platelets: 242 10*3/uL (ref 150.0–400.0)
RBC: 4.98 Mil/uL (ref 3.87–5.11)
RDW: 13.7 % (ref 11.5–15.5)
WBC: 5.6 10*3/uL (ref 4.0–10.5)

## 2023-07-29 LAB — URINALYSIS, ROUTINE W REFLEX MICROSCOPIC
Bilirubin Urine: NEGATIVE
Hgb urine dipstick: NEGATIVE
Ketones, ur: NEGATIVE
Leukocytes,Ua: NEGATIVE
Nitrite: NEGATIVE
RBC / HPF: NONE SEEN (ref 0–?)
Specific Gravity, Urine: 1.015 (ref 1.000–1.030)
Total Protein, Urine: NEGATIVE
Urine Glucose: >=1000 — AB
Urobilinogen, UA: 1 (ref 0.0–1.0)
pH: 7 (ref 5.0–8.0)

## 2023-07-29 LAB — MICROALBUMIN / CREATININE URINE RATIO
Creatinine,U: 68.8 mg/dL
Microalb Creat Ratio: 1.3 mg/g (ref 0.0–30.0)
Microalb, Ur: 0.9 mg/dL (ref 0.0–1.9)

## 2023-07-29 LAB — BASIC METABOLIC PANEL
BUN: 18 mg/dL (ref 6–23)
CO2: 32 meq/L (ref 19–32)
Calcium: 10.2 mg/dL (ref 8.4–10.5)
Chloride: 100 meq/L (ref 96–112)
Creatinine, Ser: 0.75 mg/dL (ref 0.40–1.20)
GFR: 90.33 mL/min (ref >60.00)
Glucose, Bld: 94 mg/dL (ref 70–99)
Potassium: 3.8 meq/L (ref 3.5–5.1)
Sodium: 139 meq/L (ref 135–145)

## 2023-07-29 LAB — HEMOGLOBIN A1C: Hgb A1c MFr Bld: 5.3 % (ref 4.6–6.5)

## 2023-07-29 LAB — HEPATIC FUNCTION PANEL
ALT: 11 U/L (ref 0–35)
AST: 15 U/L (ref 0–37)
Albumin: 4.7 g/dL (ref 3.5–5.2)
Alkaline Phosphatase: 60 U/L (ref 39–117)
Bilirubin, Direct: 0.2 mg/dL (ref 0.0–0.3)
Total Bilirubin: 0.7 mg/dL (ref 0.2–1.2)
Total Protein: 7.6 g/dL (ref 6.0–8.3)

## 2023-07-29 LAB — TSH: TSH: 2.29 u[IU]/mL (ref 0.35–5.50)

## 2023-07-29 MED ORDER — INDAPAMIDE 1.25 MG PO TABS: 1.2500 mg | ORAL_TABLET | Freq: Every day | ORAL | 0 refills | Status: DC

## 2023-07-29 MED ORDER — OLMESARTAN MEDOXOMIL 20 MG PO TABS: 20.0000 mg | ORAL_TABLET | Freq: Every day | ORAL | 1 refills | Status: DC

## 2023-07-29 NOTE — Patient Instructions (Signed)
Hypertension, Adult High blood pressure (hypertension) is when the force of blood pumping through the arteries is too strong. The arteries are the blood vessels that carry blood from the heart throughout the body. Hypertension forces the heart to work harder to pump blood and may cause arteries to become narrow or stiff. Untreated or uncontrolled hypertension can lead to a heart attack, heart failure, a stroke, kidney disease, and other problems. A blood pressure reading consists of a higher number over a lower number. Ideally, your blood pressure should be below 120/80. The first ("top") number is called the systolic pressure. It is a measure of the pressure in your arteries as your heart beats. The second ("bottom") number is called the diastolic pressure. It is a measure of the pressure in your arteries as the heart relaxes. What are the causes? The exact cause of this condition is not known. There are some conditions that result in high blood pressure. What increases the risk? Certain factors may make you more likely to develop high blood pressure. Some of these risk factors are under your control, including: Smoking. Not getting enough exercise or physical activity. Being overweight. Having too much fat, sugar, calories, or salt (sodium) in your diet. Drinking too much alcohol. Other risk factors include: Having a personal history of heart disease, diabetes, high cholesterol, or kidney disease. Stress. Having a family history of high blood pressure and high cholesterol. Having obstructive sleep apnea. Age. The risk increases with age. What are the signs or symptoms? High blood pressure may not cause symptoms. Very high blood pressure (hypertensive crisis) may cause: Headache. Fast or irregular heartbeats (palpitations). Shortness of breath. Nosebleed. Nausea and vomiting. Vision changes. Severe chest pain, dizziness, and seizures. How is this diagnosed? This condition is diagnosed by  measuring your blood pressure while you are seated, with your arm resting on a flat surface, your legs uncrossed, and your feet flat on the floor. The cuff of the blood pressure monitor will be placed directly against the skin of your upper arm at the level of your heart. Blood pressure should be measured at least twice using the same arm. Certain conditions can cause a difference in blood pressure between your right and left arms. If you have a high blood pressure reading during one visit or you have normal blood pressure with other risk factors, you may be asked to: Return on a different day to have your blood pressure checked again. Monitor your blood pressure at home for 1 week or longer. If you are diagnosed with hypertension, you may have other blood or imaging tests to help your health care provider understand your overall risk for other conditions. How is this treated? This condition is treated by making healthy lifestyle changes, such as eating healthy foods, exercising more, and reducing your alcohol intake. You may be referred for counseling on a healthy diet and physical activity. Your health care provider may prescribe medicine if lifestyle changes are not enough to get your blood pressure under control and if: Your systolic blood pressure is above 130. Your diastolic blood pressure is above 80. Your personal target blood pressure may vary depending on your medical conditions, your age, and other factors. Follow these instructions at home: Eating and drinking  Eat a diet that is high in fiber and potassium, and low in sodium, added sugar, and fat. An example of this eating plan is called the DASH diet. DASH stands for Dietary Approaches to Stop Hypertension. To eat this way: Eat   plenty of fresh fruits and vegetables. Try to fill one half of your plate at each meal with fruits and vegetables. Eat whole grains, such as whole-wheat pasta, brown rice, or whole-grain bread. Fill about one  fourth of your plate with whole grains. Eat or drink low-fat dairy products, such as skim milk or low-fat yogurt. Avoid fatty cuts of meat, processed or cured meats, and poultry with skin. Fill about one fourth of your plate with lean proteins, such as fish, chicken without skin, beans, eggs, or tofu. Avoid pre-made and processed foods. These tend to be higher in sodium, added sugar, and fat. Reduce your daily sodium intake. Many people with hypertension should eat less than 1,500 mg of sodium a day. Do not drink alcohol if: Your health care provider tells you not to drink. You are pregnant, may be pregnant, or are planning to become pregnant. If you drink alcohol: Limit how much you have to: 0-1 drink a day for women. 0-2 drinks a day for men. Know how much alcohol is in your drink. In the [place] one drink equals one 12 oz bottle of beer (355 mL), one 5 oz glass of wine (148 mL), or one 1 oz glass of hard liquor (44 mL). Lifestyle  Work with your health care provider to maintain a healthy body weight or to lose weight. Ask what an ideal weight is for you. Get at least 30 minutes of exercise that causes your heart to beat faster (aerobic exercise) most days of the week. Activities may include walking, swimming, or biking. Include exercise to strengthen your muscles (resistance exercise), such as Pilates or lifting weights, as part of your weekly exercise routine. Try to do these types of exercises for 30 minutes at least 3 days a week. Do not use any products that contain nicotine or tobacco. These products include cigarettes, chewing tobacco, and vaping devices, such as e-cigarettes. If you need help quitting, ask your health care provider. Monitor your blood pressure at home as told by your health care provider. Keep all follow-up visits. This is important. Medicines Take over-the-counter and prescription medicines only as told by your health care provider. Follow directions carefully. Blood  pressure medicines must be taken as prescribed. Do not skip doses of blood pressure medicine. Doing this puts you at risk for problems and can make the medicine less effective. Ask your health care provider about side effects or reactions to medicines that you should watch for. Contact a health care provider if you: Think you are having a reaction to a medicine you are taking. Have headaches that keep coming back (recurring). Feel dizzy. Have swelling in your ankles. Have trouble with your vision. Get help right away if you: Develop a severe headache or confusion. Have unusual weakness or numbness. Feel faint. Have severe pain in your chest or abdomen. Vomit repeatedly. Have trouble breathing. These symptoms may be an emergency. Get help right away. Call 911. Do not wait to see if the symptoms will go away. Do not drive yourself to the hospital. Summary Hypertension is when the force of blood pumping through your arteries is too strong. If this condition is not controlled, it may put you at risk for serious complications. Your personal target blood pressure may vary depending on your medical conditions, your age, and other factors. For most people, a normal blood pressure is less than 120/80. Hypertension is treated with lifestyle changes, medicines, or a combination of both. Lifestyle changes include losing weight, eating a healthy,   low-sodium diet, exercising more, and limiting alcohol. This information is not intended to replace advice given to you by your health care provider. Make sure you discuss any questions you have with your health care provider. Document Revised: 07/16/2021 Document Reviewed: 07/16/2021 Elsevier Patient Education  2024 [employer]  

## 2023-07-29 NOTE — Progress Notes (Signed)
Subjective:  Patient ID: Suzanne Bartlett, female    DOB: Apr 22, 1971  Age: 52 y.o. MRN: [id number]  CC: Hypertension, Hyperlipidemia, and Diabetes   HPI Suzanne Bartlett presents for f/up ----    Discussed the use of AI scribe software for clinical note transcription with the patient, who gave verbal consent to proceed.  History of Present Illness   The patient, with a history of hypertension, presents with a chief complaint of constipation, which has worsened since starting [facility]. She manages this with MiraLAX. She denies any chest pain, shortness of breath, dizziness, lightheadedness, or blood sugar symptoms. She reports feeling physically well and is tolerating Mounjaro without any other side effects.  In terms of physical activity, the patient has recently purchased an elliptical machine and has been gradually increasing her endurance, currently up to seven minutes without any associated chest pain, shortness of breath, or dizziness. She denies any leg pain while walking.   The patient's last eye exam was in January or February.       Outpatient Medications Prior to Visit  Medication Sig Dispense Refill   aspirin EC 81 MG tablet Take 1 tablet (81 mg total) by mouth daily. Swallow whole. 90 tablet 3   empagliflozin (JARDIANCE) 10 MG TABS tablet Take 1 tablet (10 mg total) by mouth daily before breakfast. 90 tablet 3   levocetirizine (XYZAL) 5 MG tablet Take 1 tablet (5 mg total) by mouth every evening. 90 tablet 1   MOUNJARO 10 MG/0.5ML Pen INJECT  10MG  SUBCUTANEOUSLY ONCE A WEEK . APPOINTMENT REQUIRED FOR FUTURE REFILLS 8 mL 0   OneTouch Delica Lancets 33G MISC See admin instructions.     ONETOUCH VERIO test strip USE 1 STRIP TO CHECK GLUCOSE ONCE DAILY  11   rosuvastatin (CRESTOR) 5 MG tablet Take 1 tablet by mouth once daily 90 tablet 0   Continuous Blood Gluc Receiver (FREESTYLE LIBRE 2 READER) DEVI 1 Act by Does not apply route daily. 2 each 5   Continuous Blood  Gluc Sensor (FREESTYLE LIBRE 2 SENSOR) MISC 1 Act by Does not apply route daily. 2 each 5   hydrochlorothiazide (MICROZIDE) 12.5 MG capsule Take 1 capsule (12.5 mg total) by mouth daily. 90 capsule 3   losartan (COZAAR) 25 MG tablet Take 1 tablet (25 mg total) by mouth daily. 90 tablet 3   diclofenac (VOLTAREN) 75 MG EC tablet Take 75 mg by mouth at bedtime.     KLOR-CON M15 15 MEQ TBCR Take 1 tablet (15 mEq total) by mouth 2 (two) times daily. 180 each 1   MOUNJARO 7.5 MG/0.5ML Pen INJECT 7.5 MG  SUBCUTANEOUSLY ONCE A WEEK 12 mL 0   No facility-administered medications prior to visit.    ROS Review of Systems  Constitutional:  Negative for chills, diaphoresis, fatigue and unexpected weight change.  HENT: Negative.    Eyes: Negative.   Respiratory:  Negative for cough, chest tightness and wheezing.   Cardiovascular:  Negative for chest pain, palpitations and leg swelling.  Gastrointestinal:  Positive for constipation. Negative for abdominal pain, blood in stool, diarrhea, nausea and vomiting.  Endocrine: Negative.   Genitourinary: Negative.  Negative for difficulty urinating.  Musculoskeletal: Negative.  Negative for back pain, joint swelling and myalgias.  Skin: Negative.  Negative for color change.  Neurological:  Negative for dizziness, weakness, light-headedness and headaches.  Hematological:  Negative for adenopathy. Does not bruise/bleed easily.  Psychiatric/Behavioral: Negative.      Objective:  BP (!) 142/88 (BP  Location: Left Arm, Patient Position: Sitting, Cuff Size: Normal)   Pulse 81   Temp 97.9 F (36.6 C) (Oral)   Resp 16   Ht 5\' 5"  (1.651 m)   Wt 212 lb 6.4 oz (96.3 kg)   LMP 02/19/2021 (Exact Date)   SpO2 96%   BMI 35.35 kg/m   BP Readings from Last 3 Encounters:  07/29/23 (!) 142/88  12/03/22 130/88  10/27/22 124/78    Wt Readings from Last 3 Encounters:  07/29/23 212 lb 6.4 oz (96.3 kg)  12/03/22 209 lb 9.6 oz (95.1 kg)  10/27/22 206 lb (93.4 kg)     Physical Exam Vitals reviewed.  Constitutional:      Appearance: Normal appearance.  HENT:     Mouth/Throat:     Mouth: Mucous membranes are moist.  Eyes:     General: No scleral icterus.    Conjunctiva/sclera: Conjunctivae normal.  Cardiovascular:     Rate and Rhythm: Normal rate and regular rhythm.     Heart sounds: No murmur heard. Pulmonary:     Effort: Pulmonary effort is normal.     Breath sounds: No stridor. No wheezing, rhonchi or rales.  Abdominal:     General: Abdomen is flat.     Palpations: There is no mass.     Tenderness: There is no abdominal tenderness. There is no guarding.     Hernia: No hernia is present.  Musculoskeletal:        General: Normal range of motion.     Cervical back: Neck supple.     Right lower leg: No edema.     Left lower leg: No edema.  Lymphadenopathy:     Cervical: No cervical adenopathy.  Skin:    General: Skin is warm and dry.  Neurological:     General: No focal deficit present.     Mental Status: She is alert.     Lab Results  Component Value Date   WBC 5.6 07/29/2023   HGB 14.8 07/29/2023   HCT 43.7 07/29/2023   PLT 242.0 07/29/2023   GLUCOSE 94 07/29/2023   CHOL 118 07/29/2023   TRIG 77.0 07/29/2023   HDL 51.10 07/29/2023   LDLCALC 52 07/29/2023   ALT 11 07/29/2023   AST 15 07/29/2023   NA 139 07/29/2023   K 3.8 07/29/2023   CL 100 07/29/2023   CREATININE 0.75 07/29/2023   BUN 18 07/29/2023   CO2 32 07/29/2023   TSH 2.29 07/29/2023   HGBA1C 5.3 07/29/2023   MICROALBUR 0.9 07/29/2023    MM 3D SCREENING MAMMOGRAM BILATERAL BREAST  Result Date: 01/01/2023 CLINICAL DATA:  Screening. EXAM: DIGITAL SCREENING BILATERAL MAMMOGRAM WITH TOMOSYNTHESIS AND CAD TECHNIQUE: Bilateral screening digital craniocaudal and mediolateral oblique mammograms were obtained. Bilateral screening digital breast tomosynthesis was performed. The images were evaluated with computer-aided detection. COMPARISON:  Previous exam(s). ACR  Breast Density Category b: There are scattered areas of fibroglandular density. FINDINGS: There are no findings suspicious for malignancy. IMPRESSION: No mammographic evidence of malignancy. A result letter of this screening mammogram will be mailed directly to the patient. RECOMMENDATION: Screening mammogram in one year. (Code:SM-B-01Y) BI-RADS CATEGORY  1: Negative. Electronically Signed   By: Bary Richard M.D.   On: 01/01/2023 11:21    Assessment & Plan:   Type II diabetes mellitus with manifestations (HCC) - Her blood sugar is well controlled. -     Microalbumin / creatinine urine ratio; Future -     Basic metabolic panel; Future -  Hemoglobin A1c; Future -     Hepatic function panel; Future -     Olmesartan Medoxomil; Take 1 tablet (20 mg total) by mouth daily.  Dispense: 90 tablet; Refill: 1  Hyperlipidemia LDL goal <100 - LDL goal achieved. Doing well on the statin  -     Lipid panel; Future -     Hepatic function panel; Future -     TSH; Future  Primary hypertension- She has not achieved her blood pressure goal of 130/80.  I recommended she upgrade to a more potent ARB and a more potent thiazide diuretic. -     Basic metabolic panel; Future -     CBC with Differential/Platelet; Future -     Urinalysis, Routine w reflex microscopic; Future -     Hepatic function panel; Future -     TSH; Future -     Olmesartan Medoxomil; Take 1 tablet (20 mg total) by mouth daily.  Dispense: 90 tablet; Refill: 1 -     Indapamide; Take 1 tablet (1.25 mg total) by mouth daily.  Dispense: 90 tablet; Refill: 0 -     AMB Referral [facility] Care Management     Follow-up: Return in about 4 months (around 11/26/2023).  Sanda Linger, MD

## 2023-08-05 ENCOUNTER — Encounter: Payer: Self-pay | Admitting: Internal Medicine

## 2023-08-05 NOTE — Telephone Encounter (Signed)
Care team updated and letter sent for eye exam notes.

## 2023-08-06 ENCOUNTER — Telehealth: Payer: Self-pay

## 2023-08-06 NOTE — Progress Notes (Signed)
   Care Guide Note  08/06/2023 Name: Suzanne Bartlett MRN: [id number] DOB: 1971/06/04  Referred by: Etta Grandchild, MD Reason for referral : Care Coordination (Outreach to schedule with Pharm d )   Suzanne Bartlett is a 52 y.o. year old female who is a primary care patient of Etta Grandchild, MD. Suzanne Bartlett was referred to the pharmacist for assistance related to HTN.    Successful contact was made with the patient to discuss pharmacy services including being ready for the pharmacist to call at least 5 minutes before the scheduled appointment time, to have medication bottles and any blood sugar or blood pressure readings ready for review. The patient agreed to meet with the pharmacist via with the pharmacist via telephone visit on (date/time).  08/12/2023  Penne Lash, RMA Care Guide [facility]  [place], [place] [address] Direct Dial: [phone] [email or web address]

## 2023-08-12 ENCOUNTER — Other Ambulatory Visit: Payer: BC Managed Care – PPO | Admitting: Pharmacist

## 2023-08-12 DIAGNOSIS — I10 Essential (primary) hypertension: Secondary | ICD-10-CM

## 2023-08-12 NOTE — Patient Instructions (Signed)
It was a pleasure speaking with you today!  Continue your current medication regimen and continue monitoring blood pressure at home. Goal blood pressure is to be below 130/80 on average. If you have having blood pressures frequently above 130/80 or having low blood pressures with symptoms such as dizziness, give me a call.  Feel free to call with any questions or concerns!  Arbutus Leas, PharmD, BCPS [facility] Clinical Pharmacist [facility] [phone]

## 2023-08-12 NOTE — Progress Notes (Signed)
08/12/2023 Name: Suzanne Bartlett MRN: [id number] DOB: 1971-06-08  Chief Complaint  Patient presents with   Hypertension   Medication Management    Suzanne Bartlett is a 52 y.o. year old female who presented for a telephone visit.   They were referred to the pharmacist by their PCP for assistance in managing hypertension.   No headache Possibly some dizziness, on Monday BP was 126/79 when had some dizziness Monday night checked again was around the same  Subjective:  Care Team: Primary Care Provider: Etta Grandchild, MD ; Next Scheduled Visit: not scheduled, due March   Medication Access/Adherence  Current Pharmacy:  [facility] [address], [place] - [address] [place] [address] Phone: [phone] Fax: [phone]   Patient reports affordability concerns with their medications: No  Patient reports access/transportation concerns to their pharmacy: No  Patient reports adherence concerns with their medications:  No     Hypertension:  Current medications: indapamide 1.25 mg daily, olmesartan 20 mg daily Medications previously tried: hydrochlorothiazide and losartan recently stopped due to elevated BP  Patient has a validated, automated, upper arm home BP cuff Current blood pressure readings: Monday BP was 126/79 when had some dizziness Monday night checked again was around the same  Patient reports hypotensive s/sx including one episode dizziness, lightheadedness since medication change     Objective:  Lab Results  Component Value Date   HGBA1C 5.3 07/29/2023    Lab Results  Component Value Date   CREATININE 0.75 07/29/2023   BUN 18 07/29/2023   NA 139 07/29/2023   K 3.8 07/29/2023   CL 100 07/29/2023   CO2 32 07/29/2023    Lab Results  Component Value Date   CHOL 118 07/29/2023   HDL 51.10 07/29/2023   LDLCALC 52 07/29/2023   TRIG 77.0 07/29/2023   CHOLHDL 2 07/29/2023    Medications  Reviewed Today     Reviewed by Bonita Quin, RPH (Pharmacist) on 08/12/23 at 1511  Med List Status: <None>   Medication Order Taking? Sig Documenting Provider Last Dose Status Informant  aspirin EC 81 MG tablet [id number] Yes Take 1 tablet (81 mg total) by mouth daily. Swallow whole. Orbie Pyo, MD Taking Active   empagliflozin (JARDIANCE) 10 MG TABS tablet [id number] Yes Take 1 tablet (10 mg total) by mouth daily before breakfast. Sharlene Dory, PA-C Taking Active   indapamide (LOZOL) 1.25 MG tablet [id number] Yes Take 1 tablet (1.25 mg total) by mouth daily. Etta Grandchild, MD Taking Active   levocetirizine (XYZAL) 5 MG tablet [id number]  Take 1 tablet (5 mg total) by mouth every evening. Etta Grandchild, MD  Active   MOUNJARO 10 MG/0.5ML Pen [id number] Yes INJECT  10MG  SUBCUTANEOUSLY ONCE A WEEK . APPOINTMENT REQUIRED FOR FUTURE REFILLS Etta Grandchild, MD Taking Active   olmesartan (BENICAR) 20 MG tablet [id number] Yes Take 1 tablet (20 mg total) by mouth daily. Etta Grandchild, MD Taking Active   [facility] Lancets 33G [place] [id number]  See admin instructions. [provider]  Active   [facility] VERIO test strip [id number]  USE 1 STRIP TO CHECK GLUCOSE ONCE DAILY [provider]  Active   rosuvastatin (CRESTOR) 5 MG tablet [id number] Yes Take 1 tablet by mouth once daily Etta Grandchild, MD Taking Active               Assessment/Plan:   Hypertension: - Currently controlled, BP goal <130/80 - Reviewed appropriate  blood pressure monitoring technique and reviewed goal blood pressure. Recommended to check home blood pressure and heart rate  - Recommend to continue indapamide and olmesartan. Recommended to contact me if experiencing high or low BP readings or if having symptoms such as dizziness     Follow Up Plan: PRN  Arbutus Leas, PharmD, BCPS Clinical Pharmacist [facility] [phone]

## 2023-09-02 ENCOUNTER — Other Ambulatory Visit: Payer: Self-pay | Admitting: Internal Medicine

## 2023-09-02 DIAGNOSIS — E118 Type 2 diabetes mellitus with unspecified complications: Secondary | ICD-10-CM

## 2023-09-02 MED ORDER — MOUNJARO 10 MG/0.5ML ~~LOC~~ SOAJ: SUBCUTANEOUS | 0 refills | Status: DC

## 2023-09-17 ENCOUNTER — Other Ambulatory Visit: Payer: Self-pay | Admitting: Internal Medicine

## 2023-09-17 DIAGNOSIS — E785 Hyperlipidemia, unspecified: Secondary | ICD-10-CM

## 2023-10-25 ENCOUNTER — Other Ambulatory Visit: Payer: Self-pay | Admitting: Internal Medicine

## 2023-10-25 DIAGNOSIS — I10 Essential (primary) hypertension: Secondary | ICD-10-CM

## 2023-11-17 ENCOUNTER — Other Ambulatory Visit: Payer: Self-pay | Admitting: Internal Medicine

## 2023-11-17 DIAGNOSIS — E118 Type 2 diabetes mellitus with unspecified complications: Secondary | ICD-10-CM

## 2023-11-17 MED ORDER — MOUNJARO 10 MG/0.5ML ~~LOC~~ SOAJ: SUBCUTANEOUS | 0 refills | Status: DC

## 2023-11-17 NOTE — Telephone Encounter (Signed)
 Copied from CRM [id number]. Topic: Clinical - Medication Refill >> Nov 17, 2023 12:13 PM Irine Seal wrote: Most Recent Primary Care Visit:  Provider: Candy Sledge R  Department: [facility]  Visit Type: PATIENT OUTREACH 60  Date: 08/12/2023  Medication: MOUNJARO 10 MG/0.5ML Pen   Has the patient contacted their pharmacy? No (Agent: If no, request that the patient contact the pharmacy for the refill. If patient does not wish to contact the pharmacy document the reason why and proceed with request.) (Agent: If yes, when and what did the pharmacy advise?)  Is this the correct pharmacy for this prescription? Yes If no, delete pharmacy and type the correct one.  This is the patient's preferred pharmacy:  [facility] [address], [place] - [address] [place] [address] Phone: [phone] Fax: [phone]   Has the prescription been filled recently? No  Is the patient out of the medication? Yes  Has the patient been seen for an appointment in the last year OR does the patient have an upcoming appointment? Will be out before upcoming appt   Can we respond through MyChart? Yes  Agent: Please be advised that Rx refills may take up to 3 business days. We ask that you follow-up with your pharmacy.

## 2023-11-17 NOTE — Telephone Encounter (Signed)
 Last Fill: 09/02/23  Last OV: 07/29/23 Next OV: 12/23/23  Routing to provider for review/authorization.

## 2023-11-26 ENCOUNTER — Other Ambulatory Visit: Payer: Self-pay | Admitting: Physician Assistant

## 2023-12-03 ENCOUNTER — Ambulatory Visit: Payer: BC Managed Care – PPO | Admitting: Internal Medicine

## 2023-12-23 ENCOUNTER — Encounter: Payer: Self-pay | Admitting: Internal Medicine

## 2023-12-23 ENCOUNTER — Ambulatory Visit: Payer: BC Managed Care – PPO | Admitting: Internal Medicine

## 2023-12-23 VITALS — BP 128/86 | HR 79 | Temp 97.7°F | Resp 16 | Ht 65.0 in | Wt 215.0 lb

## 2023-12-23 DIAGNOSIS — M1990 Unspecified osteoarthritis, unspecified site: Secondary | ICD-10-CM | POA: Diagnosis not present

## 2023-12-23 DIAGNOSIS — Z0001 Encounter for general adult medical examination with abnormal findings: Secondary | ICD-10-CM | POA: Diagnosis not present

## 2023-12-23 DIAGNOSIS — E785 Hyperlipidemia, unspecified: Secondary | ICD-10-CM

## 2023-12-23 DIAGNOSIS — I10 Essential (primary) hypertension: Secondary | ICD-10-CM

## 2023-12-23 DIAGNOSIS — E118 Type 2 diabetes mellitus with unspecified complications: Secondary | ICD-10-CM

## 2023-12-23 DIAGNOSIS — R768 Other specified abnormal immunological findings in serum: Secondary | ICD-10-CM

## 2023-12-23 LAB — BASIC METABOLIC PANEL WITH GFR
BUN: 15 mg/dL (ref 6–23)
CO2: 32 meq/L (ref 19–32)
Calcium: 9.7 mg/dL (ref 8.4–10.5)
Chloride: 100 meq/L (ref 96–112)
Creatinine, Ser: 0.69 mg/dL (ref 0.40–1.20)
GFR: 98.19 mL/min (ref >60.00)
Glucose, Bld: 88 mg/dL (ref 70–99)
Potassium: 3.5 meq/L (ref 3.5–5.1)
Sodium: 139 meq/L (ref 135–145)

## 2023-12-23 LAB — HEMOGLOBIN A1C: Hgb A1c MFr Bld: 5.4 % (ref 4.6–6.5)

## 2023-12-23 LAB — CK: Total CK: 414 U/L — ABNORMAL HIGH (ref 7–177)

## 2023-12-23 LAB — C-REACTIVE PROTEIN: CRP: <1 mg/dL (ref 0.5–20.0)

## 2023-12-23 NOTE — Patient Instructions (Signed)

## 2023-12-23 NOTE — Progress Notes (Signed)
 Subjective:  Patient ID: Suzanne Bartlett, female    DOB: 07/10/1971  Age: 53 y.o. MRN: [id number]  CC: Annual Exam, Hypertension, and Hyperlipidemia   HPI Suzanne Bartlett presents for a CPX and f/up ----  Discussed the use of AI scribe software for clinical note transcription with the patient, who gave verbal consent to proceed.  History of Present Illness   Suzanne Bartlett is a 53 year old female who presents with concerns about arthritis.  She experiences tightness and soreness in her hands and wrists, particularly at night and upon waking in the morning. The pain is described as a 'very minor dull ache' during the day, which improves with movement and activities such as showering. The symptoms are bilateral but more pronounced on the right side. She notes some discomfort in her knees. The symptoms began after engaging in activities such as working outside with her husband and building a greenhouse, initially attributing the pain to overexertion. However, the symptoms have progressively worsened over time.  She mentions a 'little bit of a hip issue' which she attributes to previous weight and frequent stepping into a large SUV.  She has a history of constipation, which she manages with Miralax and magnesium supplements, maintaining regular bowel movements every one to two days.  No chest pain, shortness of breath, dizziness, or lightheadedness during physical activity, despite engaging in strenuous activities such as shoveling topsoil and moving landscape blocks.       Outpatient Medications Prior to Visit  Medication Sig Dispense Refill   aspirin EC 81 MG tablet Take 1 tablet (81 mg total) by mouth daily. Swallow whole. 90 tablet 3   empagliflozin (JARDIANCE) 10 MG TABS tablet TAKE 1 TABLET BY MOUTH ONCE DAILY BEFORE BREAKFAST 30 tablet 0   indapamide (LOZOL) 1.25 MG tablet Take 1 tablet by mouth once daily 90 tablet 0   levocetirizine (XYZAL) 5 MG tablet Take 1  tablet (5 mg total) by mouth every evening. 90 tablet 1   MOUNJARO 10 MG/0.5ML Pen INJECT  10MG  SUBCUTANEOUSLY ONCE A WEEK . 8 mL 0   olmesartan (BENICAR) 20 MG tablet Take 1 tablet (20 mg total) by mouth daily. 90 tablet 1   OneTouch Delica Lancets 33G MISC See admin instructions.     ONETOUCH VERIO test strip USE 1 STRIP TO CHECK GLUCOSE ONCE DAILY  11   rosuvastatin (CRESTOR) 5 MG tablet Take 1 tablet by mouth once daily 90 tablet 1   No facility-administered medications prior to visit.    ROS Review of Systems  Constitutional: Negative.  Negative for appetite change, chills, diaphoresis and fatigue.  HENT: Negative.    Eyes: Negative.   Respiratory:  Negative for cough, chest tightness, shortness of breath and wheezing.   Cardiovascular:  Negative for chest pain, palpitations and leg swelling.  Gastrointestinal:  Negative for abdominal pain, constipation, diarrhea, nausea and vomiting.  Endocrine: Negative.   Genitourinary: Negative.  Negative for difficulty urinating.  Musculoskeletal:  Positive for arthralgias. Negative for joint swelling and myalgias.  Neurological: Negative.   Hematological:  Negative for adenopathy. Does not bruise/bleed easily.  Psychiatric/Behavioral: Negative.      Objective:  BP 128/86 (BP Location: Left Arm, Patient Position: Sitting, Cuff Size: Normal)   Pulse 79   Temp 97.7 F (36.5 C) (Oral)   Resp 16   Ht 5\' 5"  (1.651 m)   Wt 215 lb (97.5 kg)   LMP 02/19/2021 (Exact Date)   SpO2 99%   BMI  35.78 kg/m   BP Readings from Last 3 Encounters:  12/23/23 128/86  07/29/23 (!) 142/88  12/03/22 130/88    Wt Readings from Last 3 Encounters:  12/23/23 215 lb (97.5 kg)  07/29/23 212 lb 6.4 oz (96.3 kg)  12/03/22 209 lb 9.6 oz (95.1 kg)    Physical Exam Vitals reviewed.  Constitutional:      Appearance: Normal appearance.  HENT:     Nose: Nose normal.     Mouth/Throat:     Mouth: Mucous membranes are moist.  Eyes:     General: No scleral  icterus.    Conjunctiva/sclera: Conjunctivae normal.  Cardiovascular:     Rate and Rhythm: Normal rate and regular rhythm.     Heart sounds: No murmur heard.    No friction rub. No gallop.     Comments: EKG--- NSR, 68 bpm No LVH, Q waves, or ST/T wave changes  Pulmonary:     Effort: Pulmonary effort is normal.     Breath sounds: No stridor. No wheezing, rhonchi or rales.  Abdominal:     General: Abdomen is flat. Bowel sounds are normal.     Palpations: Abdomen is soft. There is no mass.     Tenderness: There is no abdominal tenderness. There is no guarding.     Hernia: No hernia is present.  Musculoskeletal:        General: Normal range of motion.     Cervical back: Neck supple.     Right lower leg: No edema.     Left lower leg: No edema.  Lymphadenopathy:     Cervical: No cervical adenopathy.  Skin:    General: Skin is warm and dry.  Neurological:     General: No focal deficit present.     Mental Status: She is alert. Mental status is at baseline.     Lab Results  Component Value Date   WBC 5.6 07/29/2023   HGB 14.8 07/29/2023   HCT 43.7 07/29/2023   PLT 242.0 07/29/2023   GLUCOSE 88 12/23/2023   CHOL 118 07/29/2023   TRIG 77.0 07/29/2023   HDL 51.10 07/29/2023   LDLCALC 52 07/29/2023   ALT 11 07/29/2023   AST 15 07/29/2023   NA 139 12/23/2023   K 3.5 12/23/2023   CL 100 12/23/2023   CREATININE 0.69 12/23/2023   BUN 15 12/23/2023   CO2 32 12/23/2023   TSH 2.29 07/29/2023   HGBA1C 5.4 12/23/2023   MICROALBUR 0.9 07/29/2023    MM 3D SCREENING MAMMOGRAM BILATERAL BREAST Result Date: 01/01/2023 CLINICAL DATA:  Screening. EXAM: DIGITAL SCREENING BILATERAL MAMMOGRAM WITH TOMOSYNTHESIS AND CAD TECHNIQUE: Bilateral screening digital craniocaudal and mediolateral oblique mammograms were obtained. Bilateral screening digital breast tomosynthesis was performed. The images were evaluated with computer-aided detection. COMPARISON:  Previous exam(s). ACR Breast Density  Category b: There are scattered areas of fibroglandular density. FINDINGS: There are no findings suspicious for malignancy. IMPRESSION: No mammographic evidence of malignancy. A result letter of this screening mammogram will be mailed directly to the patient. RECOMMENDATION: Screening mammogram in one year. (Code:SM-B-01Y) BI-RADS CATEGORY  1: Negative. Electronically Signed   By: Bary Richard M.D.   On: 01/01/2023 11:21    Assessment & Plan:   Primary hypertension- BP is well controlled. EKG is negative for LVH. -     Basic metabolic panel with GFR; Future -     EKG 12-Lead  Type II diabetes mellitus with manifestations (HCC)- Blood sugar is well controlled. -  Basic metabolic panel with GFR; Future -     Hemoglobin A1c; Future  Hyperlipidemia LDL goal <100- CK is mildly elevated consistent with recent exercise. She denies myalgias. -     CK; Future  Encounter for general adult medical examination with abnormal findings- Exam completed, labs reviewed, vaccines reviewed, cancer screenings are UTD, pt ed material was given.   Inflammatory arthritis -     C-reactive protein; Future -     Rheumatoid factor; Future -     ANA; Future -     CK; Future -     Cyclic citrul peptide antibody, IgG; Future -     B. burgdorfi antibodies by WB; Future -     Ambulatory referral to Rheumatology  ANA positive -     Ambulatory referral to Rheumatology  Other orders -     Anti-nuclear ab-titer (ANA titer)     Follow-up: Return in about 6 months (around 06/23/2024).  Sanda Linger, MD

## 2023-12-28 DIAGNOSIS — R768 Other specified abnormal immunological findings in serum: Secondary | ICD-10-CM | POA: Insufficient documentation

## 2023-12-28 LAB — ANTI-NUCLEAR AB-TITER (ANA TITER)
ANA TITER: 1:40 {titer} — ABNORMAL HIGH
ANA Titer 1: 1:40 {titer} — ABNORMAL HIGH

## 2023-12-28 LAB — B. BURGDORFI ANTIBODIES BY WB
B burgdorferi IgG Abs (IB): NEGATIVE
B burgdorferi IgM Abs (IB): NEGATIVE
Lyme Disease 18 kD IgG: NONREACTIVE
Lyme Disease 23 kD IgG: NONREACTIVE
Lyme Disease 23 kD IgM: NONREACTIVE
Lyme Disease 28 kD IgG: NONREACTIVE
Lyme Disease 30 kD IgG: NONREACTIVE
Lyme Disease 39 kD IgG: NONREACTIVE
Lyme Disease 39 kD IgM: REACTIVE — AB
Lyme Disease 41 kD IgG: NONREACTIVE
Lyme Disease 41 kD IgM: NONREACTIVE
Lyme Disease 45 kD IgG: NONREACTIVE
Lyme Disease 58 kD IgG: NONREACTIVE
Lyme Disease 66 kD IgG: NONREACTIVE
Lyme Disease 93 kD IgG: NONREACTIVE

## 2023-12-28 LAB — ANA: Anti Nuclear Antibody (ANA): POSITIVE — AB

## 2023-12-28 LAB — RHEUMATOID FACTOR: Rheumatoid fact SerPl-aCnc: <10 [IU]/mL (ref <14)

## 2023-12-28 LAB — CYCLIC CITRUL PEPTIDE ANTIBODY, IGG: Cyclic Citrullin Peptide Ab: <16 U

## 2024-01-24 ENCOUNTER — Other Ambulatory Visit: Payer: Self-pay | Admitting: Internal Medicine

## 2024-02-10 ENCOUNTER — Other Ambulatory Visit: Payer: Self-pay | Admitting: Internal Medicine

## 2024-02-10 DIAGNOSIS — E118 Type 2 diabetes mellitus with unspecified complications: Secondary | ICD-10-CM

## 2024-02-10 DIAGNOSIS — I10 Essential (primary) hypertension: Secondary | ICD-10-CM

## 2024-02-17 ENCOUNTER — Other Ambulatory Visit: Payer: Self-pay

## 2024-02-18 DIAGNOSIS — M79641 Pain in right hand: Secondary | ICD-10-CM | POA: Diagnosis not present

## 2024-02-18 DIAGNOSIS — M79642 Pain in left hand: Secondary | ICD-10-CM | POA: Diagnosis not present

## 2024-02-18 DIAGNOSIS — M2540 Effusion, unspecified joint: Secondary | ICD-10-CM | POA: Diagnosis not present

## 2024-02-18 DIAGNOSIS — R5383 Other fatigue: Secondary | ICD-10-CM | POA: Diagnosis not present

## 2024-02-18 DIAGNOSIS — R768 Other specified abnormal immunological findings in serum: Secondary | ICD-10-CM | POA: Diagnosis not present

## 2024-03-10 ENCOUNTER — Other Ambulatory Visit: Payer: Self-pay | Admitting: Internal Medicine

## 2024-03-10 DIAGNOSIS — E118 Type 2 diabetes mellitus with unspecified complications: Secondary | ICD-10-CM

## 2024-03-10 NOTE — Progress Notes (Unsigned)
 Cardiology Office Note:   Date:  03/14/2024  ID:  Suzanne, Bartlett 1971-08-24, MRN [id number] PCP:  Joshua Debby CROME, MD  [facility] Providers Cardiologist:  Wendel Haws, MD Referring MD: Joshua Debby CROME, MD  Chief Complaint/Reason for Referral: Follow-up for syncope ASSESSMENT:    1. Syncope and collapse   2. Type 2 diabetes mellitus without complication, without long-term current use of insulin (HCC)   3. Hypertension associated with diabetes (HCC)   4. Hyperlipidemia associated with type 2 diabetes mellitus (HCC)   5. Diastolic dysfunction   6. BMI 40.0-44.9, adult [facility])     PLAN:   In order of problems listed above: Syncope: Reassuring cardiac and neurologic evaluation.  This likely represents vasovagal syncope due to GI issues.  The patient is on a number of blood pressure medicines and so she should go from sitting to standing very slowly.  No anginal symptoms.  Will have patient follow up on PRN basis. T2DM: Continue aspirin  81, olmesartan  20, and rosuvastatin  5.  Will restarting Jardiance  soon. Hypertension: Continue indapamide  1.25 daily, olmesartan  20 daily.  BP well controlled. Hyperlipidemia: LDL in November was at goal at 50; continue rosuvastatin  5. Diastolic dysfunction: Continue olmesartan  20 mg daily; restarting Jardiance . Elevated BMI: Continue Mounjaro  10 mg weekly            Dispo:  Return if symptoms worsen or fail to improve.      Medication Adjustments/Labs and Tests Ordered: Current medicines are reviewed at length with the patient today.  Concerns regarding medicines are outlined above.  The following changes have been made:  no change   Labs/tests ordered: No orders of the defined types were placed in this encounter.   Medication Changes: No orders of the defined types were placed in this encounter.   Current medicines are reviewed at length with the patient today.  The patient does not have concerns regarding medicines.  I  spent 33 minutes reviewing all clinical data during and prior to this visit including all relevant imaging studies, laboratories, clinical information from other health systems and prior notes from both Cardiology and other specialties, interviewing the patient, conducting a complete physical examination, and coordinating care in order to formulate a comprehensive and personalized evaluation and treatment plan.   History of Present Illness:    FOCUSED PROBLEM LIST:   T2DM Not on insulin Hypertension Hyperlipidemia Diastolic dysfunction G1 DD, mild LVH, EF 60 to 65% TTE 2023 BMI 40  February 2023: The patient is a 53 y.o. female with the indicated medical history here for recommendations regarding syncope.  The patient had been seen by her primary care provider last month.  She had an episode of syncope while she was having low blood sugars.  Apparently she was sleeping and was awoken by her dog.  She got up and felt nauseated.  She then stood up and lost consciousness.  She did hit her face on the edge of a couch.  She was slightly confused at that time.  Her PCP thought this was likely a vasovagal episode.  She is referred to cardiology for further evaluation.  Laboratories were drawn including a TSH and CMP as well as CBC which were well within normal limits.  She saw neurology and EEG was ordered but it was slightly thought that vasovagal syncope was the etiology.   This episode happened about a month ago.  She has had no recurrent episodes.  She denies any chest pain, palpitations, paroxysmal, dyspnea, orthopnea.  She has adjusted her diet and is on Mounjaro  for weight loss.  She has lost about 40 pounds.  She is otherwise well without complaints. Plan: Obtain echocardiogram and monitor; start aspirin  81 mg.  June 2025:  Patient consents to use of AI scribe. In the interim her echocardiogram demonstrated LVH and grade 1 diastolic dysfunction.  Her monitor was also reassuring.  She was seen  in March of last year.  She had had a couple of lightheadedness episodes happening at night.  She ended up having a neurologic evaluation including EEG which was negative.  It was thought she had suffered from vasovagal syncope.  She did have her blood pressure medicines adjusted due to low readings.  She was seen by her primary care provider in April and was doing well with no lightheadedness or dizziness despite moderate physical activity.  She has not experienced any recent episodes of lightheadedness, which were previously attributed to vasovagal syncope. An episode was triggered by getting up in the middle of the night, associated with nausea and low blood sugar levels.  She is managing constipation, a side effect of Mounjaro , with stool softeners, MiraLAX, and magnesium as needed, which has improved her gastrointestinal symptoms.  She is currently off Jardiance  due to insurance issues. Her current medications include aspirin , a cholesterol pill, and blood pressure medications. No chest pain or shortness of breath is reported, and she is able to walk without difficulty.  She has a history of poorly controlled type 2 diabetes, for which she started Mounjaro  in 2022. Since then, she has lost 70 pounds, reducing her weight from 320 pounds. She describes Mounjaro  as 'amazing' for managing her blood sugar levels.  Her cholesterol was last checked in November, with a result of 52, which she notes as being well controlled. She is concerned about the potential for stroke due to her cardiac history but has no current issues.     Current Medications: Current Meds  Medication Sig   aspirin  EC 81 MG tablet Take 1 tablet (81 mg total) by mouth daily. Swallow whole.   indapamide  (LOZOL ) 1.25 MG tablet Take 1 tablet by mouth once daily   levocetirizine (XYZAL ) 5 MG tablet Take 1 tablet (5 mg total) by mouth every evening.   MOUNJARO  10 MG/0.5ML Pen INJECT  10MG  SUBCUTANEOUSLY ONCE A WEEK .   olmesartan   (BENICAR ) 20 MG tablet Take 1 tablet by mouth once daily   OneTouch Delica Lancets 33G MISC See admin instructions.   ONETOUCH VERIO test strip USE 1 STRIP TO CHECK GLUCOSE ONCE DAILY   rosuvastatin  (CRESTOR ) 5 MG tablet Take 1 tablet by mouth once daily     Review of Systems:   Please see the history of present illness.    All other systems reviewed and are negative.     EKGs/Labs/Other Test Reviewed:   EKG: April 2025 normal sinus rhythm  EKG Interpretation Date/Time:    Ventricular Rate:    PR Interval:    QRS Duration:    QT Interval:    QTC Calculation:   R Axis:      Text Interpretation:           Risk Assessment/Calculations:          Physical Exam:   VS:  BP 110/80   Pulse 76   Ht 5' 5 (1.651 m)   Wt 206 lb (93.4 kg)   LMP 02/19/2021 (Exact Date)   SpO2 97%   BMI 34.28 kg/m  Wt Readings from Last 3 Encounters:  03/14/24 206 lb (93.4 kg)  12/23/23 215 lb (97.5 kg)  07/29/23 212 lb 6.4 oz (96.3 kg)      GENERAL:  No apparent distress, AOx3 HEENT:  No carotid bruits, +2 carotid impulses, no scleral icterus CAR: RRR no murmurs, gallops, rubs, or thrills RES:  Clear to auscultation bilaterally ABD:  Soft, nontender, nondistended, positive bowel sounds x 4 VASC:  +2 radial pulses, +2 carotid pulses NEURO:  CN 2-12 grossly intact; motor and sensory grossly intact PSYCH:  No active depression or anxiety EXT:  No edema, ecchymosis, or cyanosis  Signed, Wyndham Santilli K Maximiliano Cromartie, MD  03/14/2024 8:29 AM    [facility] [address] [place], [place], [place]  [address] Phone: [phone]; Fax: [phone]   Note:  This document was prepared using Dragon voice recognition software and may include unintentional dictation errors.

## 2024-03-14 ENCOUNTER — Encounter: Payer: Self-pay | Admitting: Internal Medicine

## 2024-03-14 ENCOUNTER — Ambulatory Visit: Attending: Internal Medicine | Admitting: Internal Medicine

## 2024-03-14 VITALS — BP 110/80 | HR 76 | Ht 65.0 in | Wt 206.0 lb

## 2024-03-14 DIAGNOSIS — R55 Syncope and collapse: Secondary | ICD-10-CM | POA: Diagnosis not present

## 2024-03-14 DIAGNOSIS — E119 Type 2 diabetes mellitus without complications: Secondary | ICD-10-CM

## 2024-03-14 DIAGNOSIS — I152 Hypertension secondary to endocrine disorders: Secondary | ICD-10-CM

## 2024-03-14 DIAGNOSIS — E1159 Type 2 diabetes mellitus with other circulatory complications: Secondary | ICD-10-CM | POA: Diagnosis not present

## 2024-03-14 DIAGNOSIS — Z6841 Body Mass Index (BMI) 40.0 and over, adult: Secondary | ICD-10-CM

## 2024-03-14 DIAGNOSIS — E1169 Type 2 diabetes mellitus with other specified complication: Secondary | ICD-10-CM | POA: Diagnosis not present

## 2024-03-14 DIAGNOSIS — I5189 Other ill-defined heart diseases: Secondary | ICD-10-CM

## 2024-03-14 DIAGNOSIS — E785 Hyperlipidemia, unspecified: Secondary | ICD-10-CM

## 2024-03-14 NOTE — Patient Instructions (Signed)

## 2024-03-15 ENCOUNTER — Other Ambulatory Visit: Payer: Self-pay | Admitting: Internal Medicine

## 2024-03-16 NOTE — Telephone Encounter (Signed)
 You can stop taking jardiance 

## 2024-03-19 ENCOUNTER — Other Ambulatory Visit: Payer: Self-pay | Admitting: Internal Medicine

## 2024-03-19 DIAGNOSIS — E785 Hyperlipidemia, unspecified: Secondary | ICD-10-CM

## 2024-05-11 LAB — HM DIABETES EYE EXAM

## 2024-05-21 ENCOUNTER — Other Ambulatory Visit: Payer: Self-pay | Admitting: Internal Medicine

## 2024-05-21 DIAGNOSIS — E118 Type 2 diabetes mellitus with unspecified complications: Secondary | ICD-10-CM

## 2024-05-21 DIAGNOSIS — I10 Essential (primary) hypertension: Secondary | ICD-10-CM

## 2024-05-25 ENCOUNTER — Telehealth: Payer: Self-pay | Admitting: Radiology

## 2024-05-25 NOTE — Telephone Encounter (Signed)
 Copied from CRM #[id number]. Topic: General - Other >> May 25, 2024  2:30 PM [place] A wrote: Reason for CRM: Patient was following up on medication request that pharmacy sent.

## 2024-05-26 ENCOUNTER — Other Ambulatory Visit: Payer: Self-pay

## 2024-05-26 DIAGNOSIS — I10 Essential (primary) hypertension: Secondary | ICD-10-CM

## 2024-05-26 DIAGNOSIS — E118 Type 2 diabetes mellitus with unspecified complications: Secondary | ICD-10-CM

## 2024-05-26 MED ORDER — INDAPAMIDE 1.25 MG PO TABS: 1.2500 mg | ORAL_TABLET | Freq: Every day | ORAL | 0 refills | Status: DC

## 2024-05-26 MED ORDER — OLMESARTAN MEDOXOMIL 20 MG PO TABS: 20.0000 mg | ORAL_TABLET | Freq: Every day | ORAL | 0 refills | Status: DC

## 2024-05-26 NOTE — Telephone Encounter (Signed)
 Copied from CRM #[id number]. Topic: Clinical - Medication Question >> May 26, 2024  3:25 PM Suzanne Bartlett wrote: Reason for CRM:  Calling F-up on medication request.  Indapamide  (1.25 mg Oral Daily),  Olmesartan  Medoxomil (20 mg Oral Daily)  [id number] - [place], [place] - [address] [place] [address] Phone: [phone] Fax: [phone]  Pt will leaving town 05/27/2024 at 2 pm. Would like to pick up medication tomorrow in the morning.

## 2024-05-26 NOTE — Telephone Encounter (Signed)
 Patients medication has been refilled, Patient has been scheduled for her 6 month follow up, Patient has been made aware.

## 2024-06-09 DIAGNOSIS — M0609 Rheumatoid arthritis without rheumatoid factor, multiple sites: Secondary | ICD-10-CM | POA: Diagnosis not present

## 2024-06-09 DIAGNOSIS — M79641 Pain in right hand: Secondary | ICD-10-CM | POA: Diagnosis not present

## 2024-06-09 DIAGNOSIS — R5383 Other fatigue: Secondary | ICD-10-CM | POA: Diagnosis not present

## 2024-06-09 DIAGNOSIS — M79642 Pain in left hand: Secondary | ICD-10-CM | POA: Diagnosis not present

## 2024-06-22 ENCOUNTER — Other Ambulatory Visit: Payer: Self-pay | Admitting: Internal Medicine

## 2024-06-22 DIAGNOSIS — E785 Hyperlipidemia, unspecified: Secondary | ICD-10-CM

## 2024-06-29 ENCOUNTER — Ambulatory Visit: Payer: Self-pay | Admitting: Internal Medicine

## 2024-06-29 ENCOUNTER — Encounter: Payer: Self-pay | Admitting: Internal Medicine

## 2024-06-29 ENCOUNTER — Ambulatory Visit: Admitting: Internal Medicine

## 2024-06-29 VITALS — BP 124/88 | HR 83 | Temp 98.6°F | Resp 16 | Ht 65.0 in | Wt 209.2 lb

## 2024-06-29 DIAGNOSIS — E118 Type 2 diabetes mellitus with unspecified complications: Secondary | ICD-10-CM | POA: Diagnosis not present

## 2024-06-29 DIAGNOSIS — M79609 Pain in unspecified limb: Secondary | ICD-10-CM | POA: Insufficient documentation

## 2024-06-29 DIAGNOSIS — I10 Essential (primary) hypertension: Secondary | ICD-10-CM | POA: Diagnosis not present

## 2024-06-29 DIAGNOSIS — R5383 Other fatigue: Secondary | ICD-10-CM | POA: Insufficient documentation

## 2024-06-29 DIAGNOSIS — M79641 Pain in right hand: Secondary | ICD-10-CM | POA: Insufficient documentation

## 2024-06-29 DIAGNOSIS — E785 Hyperlipidemia, unspecified: Secondary | ICD-10-CM | POA: Diagnosis not present

## 2024-06-29 DIAGNOSIS — E282 Polycystic ovarian syndrome: Secondary | ICD-10-CM | POA: Insufficient documentation

## 2024-06-29 DIAGNOSIS — D511 Vitamin B12 deficiency anemia due to selective vitamin B12 malabsorption with proteinuria: Secondary | ICD-10-CM | POA: Diagnosis not present

## 2024-06-29 LAB — BASIC METABOLIC PANEL WITH GFR
BUN: 14 mg/dL (ref 6–23)
CO2: 32 meq/L (ref 19–32)
Calcium: 9.8 mg/dL (ref 8.4–10.5)
Chloride: 102 meq/L (ref 96–112)
Creatinine, Ser: 0.75 mg/dL (ref 0.40–1.20)
GFR: 89.75 mL/min (ref >60.00)
Glucose, Bld: 77 mg/dL (ref 70–99)
Potassium: 3.7 meq/L (ref 3.5–5.1)
Sodium: 143 meq/L (ref 135–145)

## 2024-06-29 LAB — CBC WITH DIFFERENTIAL/PLATELET
Basophils Absolute: 0 K/uL (ref 0.0–0.1)
Basophils Relative: 0.8 % (ref 0.0–3.0)
Eosinophils Absolute: 0.1 K/uL (ref 0.0–0.7)
Eosinophils Relative: 1.1 % (ref 0.0–5.0)
HCT: 38.9 % (ref 36.0–46.0)
Hemoglobin: 13.3 g/dL (ref 12.0–15.0)
Lymphocytes Relative: 39.4 % (ref 12.0–46.0)
Lymphs Abs: 1.9 K/uL (ref 0.7–4.0)
MCHC: 34.3 g/dL (ref 30.0–36.0)
MCV: 87.2 fl (ref 78.0–100.0)
Monocytes Absolute: 0.4 K/uL (ref 0.1–1.0)
Monocytes Relative: 7.7 % (ref 3.0–12.0)
Neutro Abs: 2.4 K/uL (ref 1.4–7.7)
Neutrophils Relative %: 51 % (ref 43.0–77.0)
Platelets: 203 K/uL (ref 150.0–400.0)
RBC: 4.46 Mil/uL (ref 3.87–5.11)
RDW: 13 % (ref 11.5–15.5)
WBC: 4.8 K/uL (ref 4.0–10.5)

## 2024-06-29 LAB — LIPID PANEL
Cholesterol: 116 mg/dL (ref 0–200)
HDL: 57 mg/dL (ref >39.00)
LDL Cholesterol: 48 mg/dL (ref 0–99)
NonHDL: 59.15
Total CHOL/HDL Ratio: 2
Triglycerides: 58 mg/dL (ref 0.0–149.0)
VLDL: 11.6 mg/dL (ref 0.0–40.0)

## 2024-06-29 LAB — HEPATIC FUNCTION PANEL
ALT: 11 U/L (ref 0–35)
AST: 15 U/L (ref 0–37)
Albumin: 4.9 g/dL (ref 3.5–5.2)
Alkaline Phosphatase: 59 U/L (ref 39–117)
Bilirubin, Direct: 0.2 mg/dL (ref 0.0–0.3)
Total Bilirubin: 0.7 mg/dL (ref 0.2–1.2)
Total Protein: 7.3 g/dL (ref 6.0–8.3)

## 2024-06-29 LAB — FOLATE: Folate: 8.5 ng/mL (ref >5.9)

## 2024-06-29 LAB — HEMOGLOBIN A1C: Hgb A1c MFr Bld: 5.4 % (ref 4.6–6.5)

## 2024-06-29 LAB — TSH: TSH: 2.68 u[IU]/mL (ref 0.35–5.50)

## 2024-06-29 NOTE — Progress Notes (Signed)
 Subjective:  Patient ID: Suzanne Bartlett, female    DOB: 02-12-1971  Age: 54 y.o. MRN: [id number]  CC: Hypertension, Hyperlipidemia, and Diabetes   HPI Suzanne Bartlett presents for f/up ----  Discussed the use of AI scribe software for clinical note transcription with the patient, who gave verbal consent to proceed.  History of Present Illness Suzanne Bartlett is a 53 year old female with arthritis who presents for follow-up on her current treatment regimen.  She recently started hydroxychloroquine for arthritis after experiencing worsening pain in her hands. She reports that her rheumatologist told her her rheumatoid test was negative, but due to worsening symptoms, she was started on treatment. She reports feeling good and staying active without experiencing any chest pain, shortness of breath, dizziness, or lightheadedness during physical activity.  In June, she underwent extensive blood work which revealed low B12 and vitamin D levels. She received weekly B12 injections for four weeks. She denies any symptoms of B12 deficiency such as numbness or tingling.  She experiences chronic constipation, which she manages effectively with Miralax in her coffee.  She had an eye exam in August and is not willing to get flu, COVID, or hepatitis B vaccines, but has had the shingles vaccine.     Outpatient Medications Prior to Visit  Medication Sig Dispense Refill   aspirin  EC 81 MG tablet Take 1 tablet (81 mg total) by mouth daily. Swallow whole. 90 tablet 3   hydroxychloroquine (PLAQUENIL) 200 MG tablet Take 400 mg by mouth daily.     indapamide  (LOZOL ) 1.25 MG tablet Take 1 tablet (1.25 mg total) by mouth daily. 90 tablet 0   MOUNJARO  10 MG/0.5ML Pen INJECT 10 MG SUBCUTANEOUSLY  ONCE A WEEK 16 mL 0   olmesartan  (BENICAR ) 20 MG tablet Take 1 tablet (20 mg total) by mouth daily. 90 tablet 0   OneTouch Delica Lancets 33G MISC See admin instructions.     ONETOUCH VERIO test  strip USE 1 STRIP TO CHECK GLUCOSE ONCE DAILY  11   rosuvastatin  (CRESTOR ) 5 MG tablet Take 1 tablet by mouth once daily 90 tablet 0   empagliflozin  (JARDIANCE ) 10 MG TABS tablet Take 1 tablet (10 mg total) by mouth daily. PT. MUST KEEP UPCOMING APPOINTMENT IN ORDER TO RECEIVE FUTURE REFILLS. THANK YOU! (Patient not taking: Reported on 03/14/2024) 30 tablet 0   levocetirizine (XYZAL ) 5 MG tablet Take 1 tablet (5 mg total) by mouth every evening. 90 tablet 1   No facility-administered medications prior to visit.    ROS Review of Systems  Constitutional:  Negative for appetite change, chills, diaphoresis, fatigue and fever.  HENT: Negative.    Respiratory: Negative.  Negative for cough, chest tightness, shortness of breath and wheezing.   Cardiovascular:  Negative for chest pain, palpitations and leg swelling.  Gastrointestinal:  Positive for constipation. Negative for abdominal pain, blood in stool, diarrhea, nausea and vomiting.  Genitourinary:  Negative for difficulty urinating and dysuria.  Musculoskeletal: Negative.  Negative for arthralgias and myalgias.  Skin: Negative.   Neurological:  Negative for dizziness, weakness and headaches.  Hematological:  Negative for adenopathy. Does not bruise/bleed easily.  Psychiatric/Behavioral: Negative.      Objective:  BP 124/88 (BP Location: Left Arm, Patient Position: Sitting, Cuff Size: Normal)   Pulse 83   Temp 98.6 F (37 C) (Oral)   Resp 16   Ht 5' 5 (1.651 m)   Wt 209 lb 3.2 oz (94.9 kg)   LMP 02/19/2021 (Exact  Date)   SpO2 98%   BMI 34.81 kg/m   BP Readings from Last 3 Encounters:  06/29/24 124/88  03/14/24 110/80  12/23/23 128/86    Wt Readings from Last 3 Encounters:  06/29/24 209 lb 3.2 oz (94.9 kg)  03/14/24 206 lb (93.4 kg)  12/23/23 215 lb (97.5 kg)    Physical Exam Vitals reviewed.  Constitutional:      Appearance: Normal appearance.  HENT:     Nose: Nose normal.     Mouth/Throat:     Mouth: Mucous  membranes are moist.  Eyes:     General: No scleral icterus.    Conjunctiva/sclera: Conjunctivae normal.  Cardiovascular:     Rate and Rhythm: Normal rate and regular rhythm.     Heart sounds: No murmur heard.    No friction rub. No gallop.  Pulmonary:     Effort: Pulmonary effort is normal.     Breath sounds: No stridor. No wheezing, rhonchi or rales.  Abdominal:     General: Abdomen is flat. Bowel sounds are decreased.     Palpations: There is no hepatomegaly, splenomegaly or mass.     Tenderness: There is no abdominal tenderness. There is no guarding or rebound.     Hernia: No hernia is present.  Musculoskeletal:        General: Normal range of motion.     Cervical back: Neck supple.     Right lower leg: No edema.     Left lower leg: No edema.  Lymphadenopathy:     Cervical: No cervical adenopathy.  Skin:    General: Skin is warm and dry.     Findings: No rash.  Neurological:     General: No focal deficit present.     Mental Status: She is alert.  Psychiatric:        Mood and Affect: Mood normal.        Behavior: Behavior normal.     Lab Results  Component Value Date   WBC 4.8 06/29/2024   HGB 13.3 06/29/2024   HCT 38.9 06/29/2024   PLT 203.0 06/29/2024   GLUCOSE 77 06/29/2024   CHOL 116 06/29/2024   TRIG 58.0 06/29/2024   HDL 57.00 06/29/2024   LDLCALC 48 06/29/2024   ALT 11 06/29/2024   AST 15 06/29/2024   NA 143 06/29/2024   K 3.7 06/29/2024   CL 102 06/29/2024   CREATININE 0.75 06/29/2024   BUN 14 06/29/2024   CO2 32 06/29/2024   TSH 2.68 06/29/2024   HGBA1C 5.4 06/29/2024   MICROALBUR 1.51 05/24/2020    MM 3D SCREENING MAMMOGRAM BILATERAL BREAST Result Date: 01/01/2023 CLINICAL DATA:  Screening. EXAM: DIGITAL SCREENING BILATERAL MAMMOGRAM WITH TOMOSYNTHESIS AND CAD TECHNIQUE: Bilateral screening digital craniocaudal and mediolateral oblique mammograms were obtained. Bilateral screening digital breast tomosynthesis was performed. The images were  evaluated with computer-aided detection. COMPARISON:  Previous exam(s). ACR Breast Density Category b: There are scattered areas of fibroglandular density. FINDINGS: There are no findings suspicious for malignancy. IMPRESSION: No mammographic evidence of malignancy. A result letter of this screening mammogram will be mailed directly to the patient. RECOMMENDATION: Screening mammogram in one year. (Code:SM-B-01Y) BI-RADS CATEGORY  1: Negative. Electronically Signed   By: Lael Hines M.D.   On: 01/01/2023 11:21    Assessment & Plan:  Type II diabetes mellitus with manifestations (HCC) -     Basic metabolic panel with GFR; Future -     Hemoglobin A1c; Future -  Urinalysis, Routine w reflex microscopic; Future -     Microalbumin / creatinine urine ratio; Future -     Hepatitis B surface antibody,quantitative; Future  Hyperlipidemia LDL goal <100 -     Lipid panel; Future -     Hepatic function panel; Future -     TSH; Future  Primary hypertension -     Basic metabolic panel with GFR; Future -     CBC with Differential/Platelet; Future -     Hepatic function panel; Future -     TSH; Future -     Urinalysis, Routine w reflex microscopic; Future  Vit B12 defic anemia d/t slctv vit B12 malabsorp w protein -     Folate; Future     Follow-up: Return in about 6 months (around 12/28/2024).  Debby Molt, MD

## 2024-06-29 NOTE — Patient Instructions (Signed)
 Hypertension, Adult High blood pressure (hypertension) is when the force of blood pumping through the arteries is too strong. The arteries are the blood vessels that carry blood from the heart throughout the body. Hypertension forces the heart to work harder to pump blood and may cause arteries to become narrow or stiff. Untreated or uncontrolled hypertension can lead to a heart attack, heart failure, a stroke, kidney disease, and other problems. A blood pressure reading consists of a higher number over a lower number. Ideally, your blood pressure should be below 120/80. The first ("top") number is called the systolic pressure. It is a measure of the pressure in your arteries as your heart beats. The second ("bottom") number is called the diastolic pressure. It is a measure of the pressure in your arteries as the heart relaxes. What are the causes? The exact cause of this condition is not known. There are some conditions that result in high blood pressure. What increases the risk? Certain factors may make you more likely to develop high blood pressure. Some of these risk factors are under your control, including: Smoking. Not getting enough exercise or physical activity. Being overweight. Having too much fat, sugar, calories, or salt (sodium) in your diet. Drinking too much alcohol. Other risk factors include: Having a personal history of heart disease, diabetes, high cholesterol, or kidney disease. Stress. Having a family history of high blood pressure and high cholesterol. Having obstructive sleep apnea. Age. The risk increases with age. What are the signs or symptoms? High blood pressure may not cause symptoms. Very high blood pressure (hypertensive crisis) may cause: Headache. Fast or irregular heartbeats (palpitations). Shortness of breath. Nosebleed. Nausea and vomiting. Vision changes. Severe chest pain, dizziness, and seizures. How is this diagnosed? This condition is diagnosed by  measuring your blood pressure while you are seated, with your arm resting on a flat surface, your legs uncrossed, and your feet flat on the floor. The cuff of the blood pressure monitor will be placed directly against the skin of your upper arm at the level of your heart. Blood pressure should be measured at least twice using the same arm. Certain conditions can cause a difference in blood pressure between your right and left arms. If you have a high blood pressure reading during one visit or you have normal blood pressure with other risk factors, you may be asked to: Return on a different day to have your blood pressure checked again. Monitor your blood pressure at home for 1 week or longer. If you are diagnosed with hypertension, you may have other blood or imaging tests to help your health care provider understand your overall risk for other conditions. How is this treated? This condition is treated by making healthy lifestyle changes, such as eating healthy foods, exercising more, and reducing your alcohol intake. You may be referred for counseling on a healthy diet and physical activity. Your health care provider may prescribe medicine if lifestyle changes are not enough to get your blood pressure under control and if: Your systolic blood pressure is above 130. Your diastolic blood pressure is above 80. Your personal target blood pressure may vary depending on your medical conditions, your age, and other factors. Follow these instructions at home: Eating and drinking  Eat a diet that is high in fiber and potassium, and low in sodium, added sugar, and fat. An example of this eating plan is called the DASH diet. DASH stands for Dietary Approaches to Stop Hypertension. To eat this way: Eat  plenty of fresh fruits and vegetables. Try to fill one half of your plate at each meal with fruits and vegetables. Eat whole grains, such as whole-wheat pasta, brown rice, or whole-grain bread. Fill about one  fourth of your plate with whole grains. Eat or drink low-fat dairy products, such as skim milk or low-fat yogurt. Avoid fatty cuts of meat, processed or cured meats, and poultry with skin. Fill about one fourth of your plate with lean proteins, such as fish, chicken without skin, beans, eggs, or tofu. Avoid pre-made and processed foods. These tend to be higher in sodium, added sugar, and fat. Reduce your daily sodium intake. Many people with hypertension should eat less than 1,500 mg of sodium a day. Do not drink alcohol if: Your health care provider tells you not to drink. You are pregnant, may be pregnant, or are planning to become pregnant. If you drink alcohol: Limit how much you have to: 0-1 drink a day for women. 0-2 drinks a day for men. Know how much alcohol is in your drink. In the [place] one drink equals one 12 oz bottle of beer (355 mL), one 5 oz glass of wine (148 mL), or one 1 oz glass of hard liquor (44 mL). Lifestyle  Work with your health care provider to maintain a healthy body weight or to lose weight. Ask what an ideal weight is for you. Get at least 30 minutes of exercise that causes your heart to beat faster (aerobic exercise) most days of the week. Activities may include walking, swimming, or biking. Include exercise to strengthen your muscles (resistance exercise), such as Pilates or lifting weights, as part of your weekly exercise routine. Try to do these types of exercises for 30 minutes at least 3 days a week. Do not use any products that contain nicotine or tobacco. These products include cigarettes, chewing tobacco, and vaping devices, such as e-cigarettes. If you need help quitting, ask your health care provider. Monitor your blood pressure at home as told by your health care provider. Keep all follow-up visits. This is important. Medicines Take over-the-counter and prescription medicines only as told by your health care provider. Follow directions carefully. Blood  pressure medicines must be taken as prescribed. Do not skip doses of blood pressure medicine. Doing this puts you at risk for problems and can make the medicine less effective. Ask your health care provider about side effects or reactions to medicines that you should watch for. Contact a health care provider if you: Think you are having a reaction to a medicine you are taking. Have headaches that keep coming back (recurring). Feel dizzy. Have swelling in your ankles. Have trouble with your vision. Get help right away if you: Develop a severe headache or confusion. Have unusual weakness or numbness. Feel faint. Have severe pain in your chest or abdomen. Vomit repeatedly. Have trouble breathing. These symptoms may be an emergency. Get help right away. Call 911. Do not wait to see if the symptoms will go away. Do not drive yourself to the hospital. Summary Hypertension is when the force of blood pumping through your arteries is too strong. If this condition is not controlled, it may put you at risk for serious complications. Your personal target blood pressure may vary depending on your medical conditions, your age, and other factors. For most people, a normal blood pressure is less than 120/80. Hypertension is treated with lifestyle changes, medicines, or a combination of both. Lifestyle changes include losing weight, eating a healthy,  low-sodium diet, exercising more, and limiting alcohol. This information is not intended to replace advice given to you by your health care provider. Make sure you discuss any questions you have with your health care provider. Document Revised: 07/16/2021 Document Reviewed: 07/16/2021 Elsevier Patient Education  2024 [employer].

## 2024-06-30 LAB — HEPATITIS B SURFACE ANTIBODY, QUANTITATIVE: Hep B S AB Quant (Post): <5 m[IU]/mL — ABNORMAL LOW (ref >=10)

## 2024-09-08 DIAGNOSIS — M79642 Pain in left hand: Secondary | ICD-10-CM | POA: Diagnosis not present

## 2024-09-08 DIAGNOSIS — R5383 Other fatigue: Secondary | ICD-10-CM | POA: Diagnosis not present

## 2024-09-08 DIAGNOSIS — M79641 Pain in right hand: Secondary | ICD-10-CM | POA: Diagnosis not present

## 2024-09-08 DIAGNOSIS — M0609 Rheumatoid arthritis without rheumatoid factor, multiple sites: Secondary | ICD-10-CM | POA: Diagnosis not present

## 2024-09-10 ENCOUNTER — Other Ambulatory Visit: Payer: Self-pay | Admitting: Internal Medicine

## 2024-09-10 DIAGNOSIS — E118 Type 2 diabetes mellitus with unspecified complications: Secondary | ICD-10-CM

## 2024-09-21 ENCOUNTER — Other Ambulatory Visit: Payer: Self-pay | Admitting: Internal Medicine

## 2024-09-21 DIAGNOSIS — E785 Hyperlipidemia, unspecified: Secondary | ICD-10-CM

## 2024-09-21 DIAGNOSIS — I10 Essential (primary) hypertension: Secondary | ICD-10-CM

## 2024-09-21 DIAGNOSIS — E118 Type 2 diabetes mellitus with unspecified complications: Secondary | ICD-10-CM

## 2024-09-30 ENCOUNTER — Other Ambulatory Visit: Payer: Self-pay | Admitting: Family Medicine

## 2024-09-30 MED ORDER — OSELTAMIVIR PHOSPHATE 75 MG PO CAPS: 75.0000 mg | ORAL_CAPSULE | Freq: Two times a day (BID) | ORAL | 0 refills | Status: AC

## 2024-09-30 NOTE — Progress Notes (Signed)
 Husb w/flu-she was present with husb when I saw him other day.  Will doo meds

## 2024-10-19 ENCOUNTER — Encounter: Payer: Self-pay | Admitting: *Deleted

## 2024-10-19 NOTE — Progress Notes (Signed)
 Suzanne Bartlett                                          MRN: [id number]   10/19/2024   The [facility] Quality Team Specialist reviewed this patient medical record for the purposes of chart review for care gap closure. The following were reviewed: chart review for care gap closure-kidney health evaluation for diabetes:eGFR  and uACR.    [facility] Quality Team
